# Patient Record
Sex: Female | Born: 1975
Health system: Southern US, Community
[De-identification: ages and names within clinical notes are randomized; demographics above are authoritative.]

## PROBLEM LIST (undated history)

## (undated) DIAGNOSIS — I739 Peripheral vascular disease, unspecified: Secondary | ICD-10-CM

## (undated) DIAGNOSIS — K760 Fatty (change of) liver, not elsewhere classified: Secondary | ICD-10-CM

## (undated) DIAGNOSIS — F1910 Other psychoactive substance abuse, uncomplicated: Secondary | ICD-10-CM

## (undated) DIAGNOSIS — F32A Depression, unspecified: Secondary | ICD-10-CM

## (undated) DIAGNOSIS — F329 Major depressive disorder, single episode, unspecified: Secondary | ICD-10-CM

## (undated) DIAGNOSIS — F419 Anxiety disorder, unspecified: Secondary | ICD-10-CM

## (undated) DIAGNOSIS — M7120 Synovial cyst of popliteal space [Baker], unspecified knee: Secondary | ICD-10-CM

## (undated) DIAGNOSIS — T7840XA Allergy, unspecified, initial encounter: Secondary | ICD-10-CM

## (undated) HISTORY — DX: Depression, unspecified: F32.A

## (undated) HISTORY — PX: DILATION AND CURETTAGE OF UTERUS: SHX78

## (undated) HISTORY — DX: Anxiety disorder, unspecified: F41.9

## (undated) HISTORY — DX: Peripheral vascular disease, unspecified: I73.9

## (undated) HISTORY — DX: Synovial cyst of popliteal space (Baker), unspecified knee: M71.20

## (undated) HISTORY — DX: Fatty (change of) liver, not elsewhere classified: K76.0

## (undated) HISTORY — PX: THERAPEUTIC ABORTION: SHX798

## (undated) HISTORY — DX: Other psychoactive substance abuse, uncomplicated: F19.10

## (undated) HISTORY — DX: Major depressive disorder, single episode, unspecified: F32.9

## (undated) HISTORY — DX: Allergy, unspecified, initial encounter: T78.40XA

---

## 2000-08-16 ENCOUNTER — Emergency Department (HOSPITAL_COMMUNITY): Admission: EM | Admit: 2000-08-16 | Discharge: 2000-08-16 | Payer: Self-pay | Admitting: Emergency Medicine

## 2000-08-16 ENCOUNTER — Encounter: Payer: Self-pay | Admitting: *Deleted

## 2005-07-14 ENCOUNTER — Emergency Department (HOSPITAL_COMMUNITY): Admission: EM | Admit: 2005-07-14 | Discharge: 2005-07-14 | Payer: Self-pay | Admitting: Emergency Medicine

## 2005-07-23 ENCOUNTER — Emergency Department (HOSPITAL_COMMUNITY): Admission: EM | Admit: 2005-07-23 | Discharge: 2005-07-23 | Payer: Self-pay | Admitting: *Deleted

## 2009-08-05 ENCOUNTER — Emergency Department (HOSPITAL_BASED_OUTPATIENT_CLINIC_OR_DEPARTMENT_OTHER): Admission: EM | Admit: 2009-08-05 | Discharge: 2009-08-05 | Payer: Self-pay | Admitting: Emergency Medicine

## 2010-07-18 LAB — URINALYSIS, ROUTINE W REFLEX MICROSCOPIC
Glucose, UA: NEGATIVE mg/dL
Ketones, ur: >80 mg/dL — AB
Nitrite: NEGATIVE
Protein, ur: NEGATIVE mg/dL
Specific Gravity, Urine: 1.025 (ref 1.005–1.030)
Urobilinogen, UA: 0.2 mg/dL (ref 0.0–1.0)
pH: 6 (ref 5.0–8.0)

## 2010-07-18 LAB — PREGNANCY, URINE: Preg Test, Ur: NEGATIVE

## 2010-07-18 LAB — URINE MICROSCOPIC-ADD ON

## 2010-07-18 LAB — WET PREP, GENITAL: Yeast Wet Prep HPF POC: NONE SEEN

## 2010-07-18 LAB — GC/CHLAMYDIA PROBE AMP, GENITAL
Chlamydia, DNA Probe: NEGATIVE
GC Probe Amp, Genital: POSITIVE — AB

## 2011-07-19 ENCOUNTER — Encounter: Payer: Self-pay | Admitting: Family

## 2011-07-19 ENCOUNTER — Ambulatory Visit (INDEPENDENT_AMBULATORY_CARE_PROVIDER_SITE_OTHER): Payer: 59 | Admitting: Family

## 2011-07-19 DIAGNOSIS — Z23 Encounter for immunization: Secondary | ICD-10-CM

## 2011-07-19 DIAGNOSIS — Z0000 Encounter for general adult medical examination without abnormal findings: Secondary | ICD-10-CM | POA: Insufficient documentation
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
## 2011-07-19 NOTE — Patient Instructions (Signed)
Please complete fasting lab work- lab opens at [facility]. Schedule a follow up visit for Pap smear.  Welcome to [facility]!

## 2011-07-19 NOTE — Assessment & Plan Note (Signed)
Tetanus today, pt wishes to schedule Pap/breast exam for another day.  Obtain fasting lab work.

## 2011-07-19 NOTE — Progress Notes (Signed)
Addended by: Mervin Kung A on: 07/19/2011 04:19 PM   Modules accepted: Orders

## 2011-07-20 LAB — URINALYSIS, ROUTINE W REFLEX MICROSCOPIC
Glucose, UA: NEGATIVE mg/dL
Leukocytes, UA: NEGATIVE
Protein, ur: NEGATIVE mg/dL
Specific Gravity, Urine: 1.016 (ref 1.005–1.030)
Urobilinogen, UA: 0.2 mg/dL (ref 0.0–1.0)

## 2011-07-20 LAB — CBC WITH DIFFERENTIAL/PLATELET
Basophils Absolute: 0 10*3/uL (ref 0.0–0.1)
Basophils Relative: 1 % (ref 0–1)
Eosinophils Absolute: 0.1 10*3/uL (ref 0.0–0.7)
Eosinophils Relative: 2 % (ref 0–5)
HCT: 39.5 % (ref 36.0–46.0)
Lymphocytes Relative: 22 % (ref 12–46)
MCH: 31.6 pg (ref 26.0–34.0)
MCHC: 33.4 g/dL (ref 30.0–36.0)
MCV: 94.5 fL (ref 78.0–100.0)
Monocytes Absolute: 0.7 10*3/uL (ref 0.1–1.0)
RDW: 13.1 % (ref 11.5–15.5)

## 2011-07-20 LAB — URINALYSIS, MICROSCOPIC ONLY

## 2011-07-20 LAB — LIPID PANEL
Cholesterol: 184 mg/dL (ref 0–200)
LDL Cholesterol: 61 mg/dL (ref 0–99)
VLDL: 20 mg/dL (ref 0–40)

## 2011-07-20 LAB — BASIC METABOLIC PANEL WITH GFR
BUN: 11 mg/dL (ref 6–23)
CO2: 22 mEq/L (ref 19–32)
Calcium: 9.3 mg/dL (ref 8.4–10.5)
Chloride: 100 mEq/L (ref 96–112)
Creat: 0.55 mg/dL (ref 0.50–1.10)
Glucose, Bld: 82 mg/dL (ref 70–99)

## 2011-07-20 LAB — HEPATIC FUNCTION PANEL
ALT: 13 U/L (ref 0–35)
Albumin: 4.4 g/dL (ref 3.5–5.2)
Indirect Bilirubin: 0.5 mg/dL (ref 0.0–0.9)
Total Protein: 7.4 g/dL (ref 6.0–8.3)

## 2011-07-22 ENCOUNTER — Encounter: Payer: Self-pay | Admitting: Family

## 2011-07-22 ENCOUNTER — Telehealth: Payer: Self-pay | Admitting: Family

## 2011-07-22 NOTE — Telephone Encounter (Signed)
Pt.notified

## 2011-07-22 NOTE — Telephone Encounter (Signed)
Pls call pt and let her know that her lab work is all normal and her HIV screen is negative.

## 2011-07-23 ENCOUNTER — Ambulatory Visit: Payer: 59 | Admitting: Family

## 2011-07-25 ENCOUNTER — Telehealth: Payer: Self-pay | Admitting: Family

## 2011-07-25 NOTE — Telephone Encounter (Signed)
Left message on machine to return my call. 

## 2011-07-25 NOTE — Telephone Encounter (Signed)
Patient states that she had labs done last Friday. She would like to know if we checked to see if she was pregnant at that visit?

## 2011-07-25 NOTE — Telephone Encounter (Signed)
Pt returned my call and was advised that a pregnancy test was not performed at her last visit.

## 2011-08-19 ENCOUNTER — Ambulatory Visit (HOSPITAL_BASED_OUTPATIENT_CLINIC_OR_DEPARTMENT_OTHER): Payer: 59

## 2011-08-21 ENCOUNTER — Ambulatory Visit (HOSPITAL_BASED_OUTPATIENT_CLINIC_OR_DEPARTMENT_OTHER): Payer: 59

## 2011-08-21 ENCOUNTER — Ambulatory Visit: Payer: Self-pay | Admitting: Family Medicine

## 2011-11-21 ENCOUNTER — Ambulatory Visit (INDEPENDENT_AMBULATORY_CARE_PROVIDER_SITE_OTHER): Payer: 59 | Admitting: Family

## 2011-11-21 VITALS — BP 96/70 | HR 85 | Wt 170.0 lb

## 2011-11-21 DIAGNOSIS — Z3490 Encounter for supervision of normal pregnancy, unspecified, unspecified trimester: Secondary | ICD-10-CM

## 2011-11-21 DIAGNOSIS — N912 Amenorrhea, unspecified: Secondary | ICD-10-CM

## 2011-11-21 DIAGNOSIS — Z331 Pregnant state, incidental: Secondary | ICD-10-CM

## 2011-11-21 MED ORDER — PRENATAL MULTIVIT-IRON PO TABS: 1.0000 | ORAL_TABLET | Freq: Every day | ORAL | Status: DC

## 2011-11-21 NOTE — Progress Notes (Signed)
  Subjective:    Patient ID: Elizabeth Reese, female    DOB: [birth year], 36 y.o.   MRN: [id number]  HPI  Ms.  Elizabeth Reese is a 36 yr old female who presents today for routine GYN exam.  She reports that her menstrual period is 17 days late.  She reports breast tenderness and cramping.  Concerned she may be pregnant. She is due for pap smear. Last pap >1 yr ago.  Dr. Chilton Si.  Always normal.     Review of Systems See HPI  Past Medical History  Diagnosis Date  . Migraine     History   Social History  . Marital Status: Single    Spouse Name: N/A    Number of Children: 0  . Years of Education: N/A   Occupational History  . Not on file.   Social History Main Topics  . Smoking status: Current Some Day Smoker    Types: Cigarettes  . Smokeless tobacco: Never Used   Comment: 4 cigarettes 2-3 x weekly  . Alcohol Use: 5.0 oz/week    10 drink(s) per week  . Drug Use: Not on file  . Sexually Active: Not on file   Other Topics Concern  . Not on file   Social History Narrative   Caffeine use:  NoRegular exercise: noWorks as a Engineer, structural.Single, no kidsSome collegeLots o family nearby.     No past surgical history on file.  Family History  Problem Relation Age of Onset  . Hypertension Mother   . Hypertension Maternal Aunt   . Diabetes Maternal Uncle   . Arthritis Maternal Grandmother   . Hypertension Maternal Grandmother   . Cancer Maternal Grandfather     ? liver cancer    No Known Allergies  No current outpatient prescriptions on file prior to visit.    BP 96/70  Pulse 85  Wt 170 lb (77.111 kg)  SpO2 99%  LMP 09/30/2011       Objective:   Physical Exam  Constitutional: She is oriented to person, place, and time. She appears well-developed and well-nourished. No distress.  Cardiovascular: Normal rate and regular rhythm.   No murmur heard. Pulmonary/Chest: Effort normal and breath sounds normal. No respiratory distress. She has no wheezes.  She has no rales. She exhibits no tenderness.  Neurological: She is alert and oriented to person, place, and time.  Skin: Skin is warm and dry.  Psychiatric: She has a normal mood and affect. Her behavior is normal. Judgment and thought content normal.          Assessment & Plan:

## 2011-11-21 NOTE — Patient Instructions (Addendum)
You will be contact about your referral to OB/GYN. Please let us know if you have not heard back within 1 week about your referral. Congratulations!

## 2011-11-22 LAB — HCG, QUANTITATIVE, PREGNANCY: hCG, Beta Chain, Quant, S: 94449.2 m[IU]/mL

## 2011-11-27 DIAGNOSIS — Z3490 Encounter for supervision of normal pregnancy, unspecified, unspecified trimester: Secondary | ICD-10-CM | POA: Insufficient documentation

## 2011-11-27 NOTE — Assessment & Plan Note (Signed)
Urine hcg +.  Obtain quant hcg. Refer to OB.  Add prenatal vitamin.  Pt counseled on foods to avoid during pregnancy as well as to avoid ETOH. Defer pap to OB.

## 2012-06-23 ENCOUNTER — Ambulatory Visit: Payer: 59 | Admitting: Physical Therapy

## 2012-06-23 ENCOUNTER — Ambulatory Visit: Payer: 59 | Admitting: Family

## 2012-06-30 ENCOUNTER — Ambulatory Visit: Payer: BC Managed Care – PPO | Attending: Specialist | Admitting: Rehabilitation

## 2012-06-30 DIAGNOSIS — M25569 Pain in unspecified knee: Secondary | ICD-10-CM | POA: Insufficient documentation

## 2012-06-30 DIAGNOSIS — IMO0001 Reserved for inherently not codable concepts without codable children: Secondary | ICD-10-CM | POA: Insufficient documentation

## 2012-07-14 ENCOUNTER — Emergency Department (HOSPITAL_BASED_OUTPATIENT_CLINIC_OR_DEPARTMENT_OTHER)
Admission: EM | Admit: 2012-07-14 | Discharge: 2012-07-14 | Disposition: A | Discharge status: Home / Self Care | Payer: BC Managed Care – PPO | Attending: Emergency Medicine | Admitting: Emergency Medicine

## 2012-07-14 ENCOUNTER — Encounter (HOSPITAL_BASED_OUTPATIENT_CLINIC_OR_DEPARTMENT_OTHER): Payer: Self-pay | Admitting: *Deleted

## 2012-07-14 ENCOUNTER — Emergency Department (HOSPITAL_BASED_OUTPATIENT_CLINIC_OR_DEPARTMENT_OTHER): Payer: BC Managed Care – PPO

## 2012-07-14 DIAGNOSIS — Y929 Unspecified place or not applicable: Secondary | ICD-10-CM | POA: Insufficient documentation

## 2012-07-14 DIAGNOSIS — W2203XA Walked into furniture, initial encounter: Secondary | ICD-10-CM | POA: Insufficient documentation

## 2012-07-14 DIAGNOSIS — F17200 Nicotine dependence, unspecified, uncomplicated: Secondary | ICD-10-CM | POA: Insufficient documentation

## 2012-07-14 DIAGNOSIS — S62339A Displaced fracture of neck of unspecified metacarpal bone, initial encounter for closed fracture: Secondary | ICD-10-CM

## 2012-07-14 DIAGNOSIS — Z8679 Personal history of other diseases of the circulatory system: Secondary | ICD-10-CM | POA: Insufficient documentation

## 2012-07-14 DIAGNOSIS — S62319A Displaced fracture of base of unspecified metacarpal bone, initial encounter for closed fracture: Secondary | ICD-10-CM | POA: Insufficient documentation

## 2012-07-14 DIAGNOSIS — Y9389 Activity, other specified: Secondary | ICD-10-CM | POA: Insufficient documentation

## 2012-07-14 MED ORDER — HYDROCODONE-ACETAMINOPHEN 5-325 MG PO TABS: 2.0000 | ORAL_TABLET | ORAL | Status: DC | PRN

## 2012-07-14 NOTE — ED Notes (Signed)
EMT at bedside applying splint 

## 2012-07-14 NOTE — ED Provider Notes (Signed)
Medical screening examination/treatment/procedure(s) were performed by non-physician practitioner and as supervising physician I was immediately available for consultation/collaboration.   David H Yao, MD 07/14/12 2031 

## 2012-07-14 NOTE — ED Notes (Signed)
Pain in her right hand x 2 weeks since hitting it against a wooden bar.

## 2012-07-14 NOTE — ED Provider Notes (Signed)
History     CSN: [id number]  Arrival date & time 07/14/12  1726   First MD Initiated Contact with Patient 07/14/12 1747      Chief Complaint  Patient presents with  . Hand Injury    (Consider location/radiation/quality/duration/timing/severity/associated sxs/prior treatment) HPI Comments: Pt state that he hit her hand on the right on a table and is now having pain along the 5th metacarpal  Patient is a 37 y.o. female presenting with hand injury. The history is provided by the patient. No language interpreter was used.  Hand Injury Location:  Hand Time since incident:  1 week Hand location:  R hand Pain details:    Quality:  Aching   Radiates to:  Does not radiate   Severity:  Moderate   Timing:  Constant   Past Medical History  Diagnosis Date  . Migraine     History reviewed. No pertinent past surgical history.  Family History  Problem Relation Age of Onset  . Hypertension Mother   . Hypertension Maternal Aunt   . Diabetes Maternal Uncle   . Arthritis Maternal Grandmother   . Hypertension Maternal Grandmother   . Cancer Maternal Grandfather     ? liver cancer    History  Substance Use Topics  . Smoking status: Current Some Day Smoker -- 0.50 packs/day    Types: Cigarettes  . Smokeless tobacco: Never Used     Comment: 4 cigarettes 2-3 x weekly  . Alcohol Use: 5.0 oz/week    10 drink(s) per week    OB History   Grav Para Term Preterm Abortions TAB SAB Ect Mult Living                  Review of Systems  Constitutional: Negative.   Respiratory: Negative.   Cardiovascular: Negative.     Allergies  Review of patient's allergies indicates no known allergies.  Home Medications   Current Outpatient Rx  Name  Route  Sig  Dispense  Refill  . Prenatal Vit-Fe Sulfate-FA (PRENATAL MULTIVIT-IRON) TABS   Oral   Take 1 tablet by mouth daily.      0     BP 112/77  Pulse 76  Temp(Src) 98.3 F (36.8 C) (Oral)  Resp 16  Wt 170 lb (77.111 kg)  BMI  28.74 kg/m2  SpO2 98%  Physical Exam  Nursing note and vitals reviewed. Constitutional: She is oriented to person, place, and time. She appears well-developed and well-nourished.  Cardiovascular: Normal rate and regular rhythm.   Pulmonary/Chest: Effort normal and breath sounds normal.  Musculoskeletal:  Pt tender along the right fifth metacarpal with mild distal swelling noted:pt has full WUX:LKGMWN intact  Neurological: She is alert and oriented to person, place, and time.  Skin: Skin is warm and dry.  Psychiatric: She has a normal mood and affect.    ED Course  Procedures (including critical care time)  Labs Reviewed - No data to display Dg Hand Complete Right  07/14/2012  *RADIOLOGY REPORT*  Clinical Data: Pain and swelling in the region of the right fifth metacarpal head following an injury 1 week ago.  RIGHT HAND - COMPLETE 3+ VIEW  Comparison: None.  Findings: Fracture of the right fifth metacarpal neck with ventral radial angulation and mild ventral radial displacement of the distal fragment.  No additional fractures are seen.  IMPRESSION: Right fifth metacarpal boxer's fracture, as described above.   Original Report Authenticated By: Beckie Salts, M.D.      1. Boxer's fracture,  closed, initial encounter       MDM  Pt is okay to follow up with hand:pt splinted although the injury is a week old:pt is neurovascularly intact        Teressa Lower, NP 07/14/12 1926

## 2012-07-21 ENCOUNTER — Encounter: Payer: 59 | Admitting: Family

## 2012-09-23 ENCOUNTER — Telehealth: Payer: Self-pay | Admitting: Family

## 2012-09-23 NOTE — Telephone Encounter (Signed)
Please confirm no suicidal ideation.  I would like to see her in the office first and then we can decide how best to help her.

## 2012-09-23 NOTE — Telephone Encounter (Signed)
Please advise 

## 2012-09-23 NOTE — Telephone Encounter (Signed)
Spoke with pt, she denies suicide ideation. Has physical on 10/02/12. Advised pt that I could schedule a visit to discuss depression but will not be able to get physical any earlier. She voices understanding and scheduled appt for 09/28/12 at 3:15pm.

## 2012-09-23 NOTE — Telephone Encounter (Signed)
Patient is feeling depressed and would like a referral to a mental health professional.

## 2012-09-23 NOTE — Telephone Encounter (Signed)
Left message on voicemail to return my call.  

## 2012-09-28 ENCOUNTER — Encounter: Payer: Self-pay | Admitting: Family

## 2012-09-28 ENCOUNTER — Encounter: Payer: Self-pay | Admitting: *Deleted

## 2012-09-28 ENCOUNTER — Ambulatory Visit (INDEPENDENT_AMBULATORY_CARE_PROVIDER_SITE_OTHER): Payer: BC Managed Care – PPO | Admitting: Family

## 2012-09-28 VITALS — BP 104/80 | HR 83 | Temp 98.1°F | Resp 16 | Wt 162.0 lb

## 2012-09-28 DIAGNOSIS — F32A Depression, unspecified: Secondary | ICD-10-CM

## 2012-09-28 DIAGNOSIS — F329 Major depressive disorder, single episode, unspecified: Secondary | ICD-10-CM

## 2012-09-28 NOTE — Progress Notes (Signed)
  Subjective:    Patient ID: Elizabeth Reese, female    DOB: Mar 27, 1976, 37 y.o.   MRN: [id number]  HPI  Elizabeth Reese is a 37 yr old female who presents today to discuss depression. Reports that she was treated for zoloft x 3 years around age 70.  Took herself off.  Feels down, cries a lot.  Reports that she lives with her mom and does not feel "like an adult."  Had a miscarriage last June.  Denies active suicide ideation. Feels hopeless.  Reports that she sleeps well.  Denies panic attacks.    Review of Systems    see HPI  Past Medical History  Diagnosis Date  . Migraine     History   Social History  . Marital Status: Single    Spouse Name: N/A    Number of Children: 0  . Years of Education: N/A   Occupational History  . Not on file.   Social History Main Topics  . Smoking status: Current Some Day Smoker -- 0.50 packs/day    Types: Cigarettes  . Smokeless tobacco: Never Used     Comment: 4 cigarettes 2-3 x weekly  . Alcohol Use: 5.0 oz/week    10 drink(s) per week  . Drug Use: No  . Sexually Active: Not on file   Other Topics Concern  . Not on file   Social History Narrative   Caffeine use:  No   Regular exercise: no   Works as a Engineer, structural.   Single, no kids   Some college   Lots o family nearby.           No past surgical history on file.  Family History  Problem Relation Age of Onset  . Hypertension Mother   . Hypertension Maternal Aunt   . Diabetes Maternal Uncle   . Arthritis Maternal Grandmother   . Hypertension Maternal Grandmother   . Cancer Maternal Grandfather     ? liver cancer    No Known Allergies  No current outpatient prescriptions on file prior to visit.   No current facility-administered medications on file prior to visit.    BP 104/80  Pulse 83  Temp(Src) 98.1 F (36.7 C) (Oral)  Resp 16  Wt 162 lb 0.6 oz (73.501 kg)  BMI 27.39 kg/m2  SpO2 97%  LMP 09/12/2012    Objective:   Physical Exam   Constitutional: She appears well-developed and well-nourished. No distress.  Psychiatric: She has a normal mood and affect. Her behavior is normal. Judgment and thought content normal.          Assessment & Plan:

## 2012-09-28 NOTE — Assessment & Plan Note (Signed)
Deteriorated.  She would like referral to therapist. Will arrange.  We discussed possibility of adding medication. She wishes to try therapist first which is reasonable. Recommended that she call  If symptoms worsen, go to ED if SI.  She is agreeable and verbalizes understanding.

## 2012-09-28 NOTE — Patient Instructions (Addendum)
You will be contacted about your referral to the therapist.  Please let us know if you have not heard back within 1 week about your referral.

## 2012-10-02 ENCOUNTER — Encounter: Payer: Self-pay | Admitting: Family

## 2012-10-02 ENCOUNTER — Ambulatory Visit (INDEPENDENT_AMBULATORY_CARE_PROVIDER_SITE_OTHER): Payer: BC Managed Care – PPO | Admitting: Family

## 2012-10-02 VITALS — BP 102/72 | HR 77 | Temp 97.5°F | Resp 18 | Ht 64.5 in | Wt 161.1 lb

## 2012-10-02 DIAGNOSIS — Z0000 Encounter for general adult medical examination without abnormal findings: Secondary | ICD-10-CM

## 2012-10-02 DIAGNOSIS — N926 Irregular menstruation, unspecified: Secondary | ICD-10-CM

## 2012-10-02 LAB — LIPID PANEL
HDL: 72 mg/dL (ref >39)
LDL Cholesterol: 55 mg/dL (ref 0–99)

## 2012-10-02 LAB — TSH: TSH: 0.999 u[IU]/mL (ref 0.350–4.500)

## 2012-10-02 LAB — CBC WITH DIFFERENTIAL/PLATELET
Eosinophils Relative: 4 % (ref 0–5)
HCT: 37.1 % (ref 36.0–46.0)
Hemoglobin: 12.7 g/dL (ref 12.0–15.0)
Lymphocytes Relative: 26 % (ref 12–46)
Lymphs Abs: 1.3 10*3/uL (ref 0.7–4.0)
MCV: 90.3 fL (ref 78.0–100.0)
Monocytes Absolute: 0.4 10*3/uL (ref 0.1–1.0)
RBC: 4.11 MIL/uL (ref 3.87–5.11)
WBC: 4.8 10*3/uL (ref 4.0–10.5)

## 2012-10-02 LAB — BASIC METABOLIC PANEL WITH GFR
CO2: 24 mEq/L (ref 19–32)
Chloride: 105 mEq/L (ref 96–112)
Sodium: 137 mEq/L (ref 135–145)

## 2012-10-02 LAB — HEPATIC FUNCTION PANEL
ALT: 12 U/L (ref 0–35)
Alkaline Phosphatase: 44 U/L (ref 39–117)
Indirect Bilirubin: 0.5 mg/dL (ref 0.0–0.9)
Total Protein: 6.7 g/dL (ref 6.0–8.3)

## 2012-10-02 NOTE — Patient Instructions (Addendum)
Please complete your lab work prior to leaving.  Follow up in 3 months so we can see how things are going with your depression.

## 2012-10-02 NOTE — Progress Notes (Signed)
Subjective:    Patient ID: Elizabeth Reese, female    DOB: [birth year], 37 y.o.   MRN: [id number]  HPI    Patient presents today for complete physical.  Immunizations: tetanus Diet: working on healthy diet.  Exercise: not exercising. Pap Smear: Tobacco abuse 1 pack a week.   Past Medical History  Diagnosis Date  . Migraine   . Depression     History   Social History  . Marital Status: Single    Spouse Name: N/A    Number of Children: 0  . Years of Education: N/A   Occupational History  . Not on file.   Social History Main Topics  . Smoking status: Current Some Day Smoker -- 0.50 packs/day    Types: Cigarettes  . Smokeless tobacco: Never Used     Comment: 4 cigarettes 2-3 x weekly  . Alcohol Use: 5.0 oz/week    10 drink(s) per week  . Drug Use: No  . Sexually Active: Not on file   Other Topics Concern  . Not on file   Social History Narrative   Caffeine use:  No   Regular exercise: no   Works as a Engineer, structural.   Single, no kids   Some college   Lots o family nearby.           History reviewed. No pertinent past surgical history.  Family History  Problem Relation Age of Onset  . Hypertension Mother   . Hypertension Maternal Aunt   . Diabetes Maternal Uncle   . Arthritis Maternal Grandmother   . Hypertension Maternal Grandmother   . Cancer Maternal Grandfather     ? liver cancer    No Known Allergies  Current Outpatient Prescriptions on File Prior to Visit  Medication Sig Dispense Refill  . LO LOESTRIN FE 1 MG-10 MCG / 10 MCG tablet Take 1 tablet by mouth daily.       No current facility-administered medications on file prior to visit.    BP 102/72  Pulse 77  Temp(Src) 97.5 F (36.4 C) (Oral)  Resp 18  Ht 5' 4.5" (1.638 m)  Wt 161 lb 1.3 oz (73.065 kg)  BMI 27.23 kg/m2  SpO2 99%  LMP 09/12/2012     Review of Systems  Constitutional: Negative for unexpected weight change.  HENT: Negative for congestion.    Eyes: Negative for visual disturbance.  Respiratory: Negative for cough and choking.   Cardiovascular: Negative for chest pain.  Gastrointestinal: Negative for nausea, vomiting and diarrhea.  Genitourinary: Positive for frequency. Negative for dysuria.  Musculoskeletal: Negative for myalgias.       Right hip pain in AM's.    Skin: Negative for rash.  Neurological: Negative for headaches.  Hematological: Negative for adenopathy.  Psychiatric/Behavioral:       She will see a therapist re: depression       Objective:   Physical Exam  Physical Exam  Constitutional: She is oriented to person, place, and time. She appears well-developed and well-nourished. No distress.  HENT:  Head: Normocephalic and atraumatic.  Right Ear: Tympanic membrane and ear canal normal.  Left Ear: Tympanic membrane and ear canal normal.  Mouth/Throat: Oropharynx is clear and moist.  Eyes:  No scleral icterus.  Neck: Normal range of motion. No thyromegaly present.  Cardiovascular: Normal rate and regular rhythm.   No murmur heard. Pulmonary/Chest: Effort normal and breath sounds normal. No respiratory distress. He has no wheezes. She has no rales. She exhibits no  tenderness.  Abdominal: Soft. Bowel sounds are normal. He exhibits no distension and no mass. There is no tenderness. There is no rebound and no guarding.  Musculoskeletal: She exhibits no edema.  Lymphadenopathy:    She has no cervical adenopathy.  Neurological: She is alert and oriented to person, place, and time. She has normal reflexes. She exhibits normal muscle tone. Coordination normal.  Skin: Skin is warm and dry.  Psychiatric: She has a normal mood and affect. Her behavior is normal. Judgment and thought content normal.  Breast/pelvic deferred to GYN         Assessment & Plan:           Assessment & Plan:  She reports that she has been having periods every other month, request Hcg.  HcG neg. Likely due to OCP.

## 2012-10-03 LAB — URINALYSIS, MICROSCOPIC ONLY

## 2012-10-03 LAB — URINALYSIS, ROUTINE W REFLEX MICROSCOPIC
Ketones, ur: NEGATIVE mg/dL
Nitrite: NEGATIVE
Protein, ur: NEGATIVE mg/dL
Specific Gravity, Urine: 1.021 (ref 1.005–1.030)
Urobilinogen, UA: 0.2 mg/dL (ref 0.0–1.0)

## 2012-10-03 LAB — PREGNANCY, URINE: Preg Test, Ur: NEGATIVE

## 2012-10-04 ENCOUNTER — Encounter: Payer: Self-pay | Admitting: Family

## 2012-10-04 DIAGNOSIS — Z0000 Encounter for general adult medical examination without abnormal findings: Secondary | ICD-10-CM | POA: Insufficient documentation

## 2012-10-04 NOTE — Assessment & Plan Note (Signed)
Continue healthy diet, exercise, immunizations up to date. Pap per GYN. We discussed importance of complete smoking cessation.

## 2012-10-06 ENCOUNTER — Ambulatory Visit (INDEPENDENT_AMBULATORY_CARE_PROVIDER_SITE_OTHER): Payer: BC Managed Care – PPO | Admitting: Licensed Clinical Social Worker

## 2012-10-06 DIAGNOSIS — F331 Major depressive disorder, recurrent, moderate: Secondary | ICD-10-CM

## 2012-10-13 ENCOUNTER — Ambulatory Visit: Payer: BC Managed Care – PPO | Admitting: Licensed Clinical Social Worker

## 2012-10-29 ENCOUNTER — Ambulatory Visit (INDEPENDENT_AMBULATORY_CARE_PROVIDER_SITE_OTHER): Payer: BC Managed Care – PPO | Admitting: Licensed Clinical Social Worker

## 2012-10-29 DIAGNOSIS — F331 Major depressive disorder, recurrent, moderate: Secondary | ICD-10-CM

## 2012-11-05 ENCOUNTER — Ambulatory Visit: Payer: BC Managed Care – PPO | Admitting: Licensed Clinical Social Worker

## 2012-11-12 ENCOUNTER — Ambulatory Visit (INDEPENDENT_AMBULATORY_CARE_PROVIDER_SITE_OTHER): Payer: BC Managed Care – PPO | Admitting: Licensed Clinical Social Worker

## 2012-11-12 DIAGNOSIS — F331 Major depressive disorder, recurrent, moderate: Secondary | ICD-10-CM

## 2012-11-26 ENCOUNTER — Ambulatory Visit (INDEPENDENT_AMBULATORY_CARE_PROVIDER_SITE_OTHER): Payer: BC Managed Care – PPO | Admitting: Licensed Clinical Social Worker

## 2012-11-26 DIAGNOSIS — F331 Major depressive disorder, recurrent, moderate: Secondary | ICD-10-CM

## 2012-12-17 ENCOUNTER — Ambulatory Visit: Payer: BC Managed Care – PPO | Admitting: Licensed Clinical Social Worker

## 2012-12-23 ENCOUNTER — Telehealth: Payer: Self-pay | Admitting: *Deleted

## 2012-12-23 MED ORDER — NORETHIN-ETH ESTRAD-FE BIPHAS 1 MG-10 MCG / 10 MCG PO TABS: 1.0000 | ORAL_TABLET | Freq: Every day | ORAL | Status: DC

## 2012-12-23 NOTE — Telephone Encounter (Signed)
Received message from pt requesting refill on lo-loestrin fe.  Refill sent to pharmacy. Pt is due for follow up in September. Please call pt to arrange appt.

## 2012-12-24 NOTE — Telephone Encounter (Signed)
Spoke to patient and informed her of medication refill and she scheduled appointment for 01/08/13

## 2013-01-08 ENCOUNTER — Ambulatory Visit: Payer: BC Managed Care – PPO | Admitting: Family

## 2013-02-22 ENCOUNTER — Encounter: Payer: Self-pay | Admitting: Family

## 2013-02-22 ENCOUNTER — Ambulatory Visit (INDEPENDENT_AMBULATORY_CARE_PROVIDER_SITE_OTHER): Payer: BC Managed Care – PPO | Admitting: Family

## 2013-02-22 VITALS — BP 110/70 | HR 67 | Temp 98.6°F | Resp 16 | Ht 64.5 in | Wt 148.1 lb

## 2013-02-22 DIAGNOSIS — Z803 Family history of malignant neoplasm of breast: Secondary | ICD-10-CM

## 2013-02-22 DIAGNOSIS — F329 Major depressive disorder, single episode, unspecified: Secondary | ICD-10-CM

## 2013-02-22 DIAGNOSIS — Z0000 Encounter for general adult medical examination without abnormal findings: Secondary | ICD-10-CM

## 2013-02-22 DIAGNOSIS — F32A Depression, unspecified: Secondary | ICD-10-CM

## 2013-02-22 MED ORDER — NORETHIN-ETH ESTRAD-FE BIPHAS 1 MG-10 MCG / 10 MCG PO TABS: 1.0000 | ORAL_TABLET | Freq: Every day | ORAL | Status: DC

## 2013-02-22 NOTE — Patient Instructions (Signed)
Please schedule mammogram- call [phone] Follow up in 6 months, sooner if problems or concerns.

## 2013-02-22 NOTE — Progress Notes (Signed)
  Subjective:    Patient ID: Elizabeth Reese, female    DOB: 12-13-1975, 37 y.o.   MRN: [id number]  HPI  Elizabeth Reese is a 37 yr old female who presents today to discuss depression. Reports that she has not met with Berniece Andreas in month due to scheduling issues.  She has started exercising and eating better.  Goes 3-4 times a week does cardio.  Reports that she has started eating more fruits/veggies.  Has cut down on alcohol. Quit smoking 12/03/12. Overall mood is improved.   Maternal GM age 85- just diagnosed with breast CA. She is requesting screening mammogram.   Review of Systems See HPI  Past Medical History  Diagnosis Date  . Migraine   . Depression     History   Social History  . Marital Status: Single    Spouse Name: N/A    Number of Children: 0  . Years of Education: N/A   Occupational History  . Not on file.   Social History Main Topics  . Smoking status: Current Some Day Smoker -- 0.50 packs/day    Types: Cigarettes  . Smokeless tobacco: Never Used     Comment: 4 cigarettes 2-3 x weekly  . Alcohol Use: 5.0 oz/week    10 drink(s) per week  . Drug Use: No  . Sexual Activity: Not on file   Other Topics Concern  . Not on file   Social History Narrative   Caffeine use:  No   Regular exercise: no   Works as a Engineer, structural.   Single, no kids   Some college   Lots o family nearby.           No past surgical history on file.  Family History  Problem Relation Age of Onset  . Hypertension Mother   . Hypertension Maternal Aunt   . Diabetes Maternal Uncle   . Arthritis Maternal Grandmother   . Hypertension Maternal Grandmother   . Cancer Maternal Grandmother 63    breast  . Cancer Maternal Grandfather     ? liver cancer    No Known Allergies  No current outpatient prescriptions on file prior to visit.   No current facility-administered medications on file prior to visit.    BP 110/70  Pulse 67  Temp(Src) 98.6 F (37 C)  (Oral)  Resp 16  Ht 5' 4.5" (1.638 m)  Wt 148 lb 1.3 oz (67.169 kg)  BMI 25.03 kg/m2  SpO2 99%       Objective:   Physical Exam  Constitutional: She is oriented to person, place, and time. She appears well-developed and well-nourished. No distress.  Cardiovascular: Normal rate and regular rhythm.   No murmur heard. Pulmonary/Chest: Effort normal and breath sounds normal. No respiratory distress. She has no wheezes. She has no rales. She exhibits no tenderness.  Musculoskeletal: She exhibits no edema.  Neurological: She is alert and oriented to person, place, and time.  Psychiatric: She has a normal mood and affect. Her behavior is normal. Judgment and thought content normal.          Assessment & Plan:

## 2013-02-24 DIAGNOSIS — Z803 Family history of malignant neoplasm of breast: Secondary | ICD-10-CM | POA: Insufficient documentation

## 2013-02-24 NOTE — Assessment & Plan Note (Signed)
Depression is stable off of meds. Continue to monitor.  I commended her on her multiple healthy lifestyle changes.

## 2013-02-24 NOTE — Assessment & Plan Note (Signed)
Refer for screening mammogram.

## 2013-05-25 ENCOUNTER — Telehealth: Payer: Self-pay | Admitting: Family

## 2013-05-25 DIAGNOSIS — Z0000 Encounter for general adult medical examination without abnormal findings: Secondary | ICD-10-CM

## 2013-05-25 MED ORDER — NORETHIN-ETH ESTRAD-FE BIPHAS 1 MG-10 MCG / 10 MCG PO TABS: 1.0000 | ORAL_TABLET | Freq: Every day | ORAL | Status: DC

## 2013-05-25 NOTE — Telephone Encounter (Signed)
Refill sent.

## 2013-05-25 NOTE — Telephone Encounter (Signed)
Would like refill of birth congtrol to [facility]

## 2013-06-01 ENCOUNTER — Other Ambulatory Visit: Payer: Self-pay | Admitting: Family

## 2013-06-01 NOTE — Telephone Encounter (Signed)
Please advise refill? Med never filled by you, do you wish to fill?

## 2013-06-02 NOTE — Telephone Encounter (Signed)
Denial sent to pharmacy with note to use refills on file.

## 2013-06-02 NOTE — Telephone Encounter (Signed)
This was filled on 1/27 by me.

## 2013-08-13 ENCOUNTER — Other Ambulatory Visit: Payer: Self-pay | Admitting: Family

## 2013-08-24 ENCOUNTER — Ambulatory Visit: Payer: BC Managed Care – PPO | Admitting: Family

## 2013-09-13 ENCOUNTER — Emergency Department (HOSPITAL_COMMUNITY)
Admission: EM | Admit: 2013-09-13 | Discharge: 2013-09-13 | Disposition: A | Discharge status: Home / Self Care | Payer: BC Managed Care – PPO | Attending: Emergency Medicine | Admitting: Emergency Medicine

## 2013-09-13 ENCOUNTER — Emergency Department (HOSPITAL_COMMUNITY): Payer: BC Managed Care – PPO

## 2013-09-13 ENCOUNTER — Encounter (HOSPITAL_COMMUNITY): Payer: Self-pay | Admitting: Emergency Medicine

## 2013-09-13 DIAGNOSIS — W01119A Fall on same level from slipping, tripping and stumbling with subsequent striking against unspecified sharp object, initial encounter: Secondary | ICD-10-CM | POA: Insufficient documentation

## 2013-09-13 DIAGNOSIS — Z79899 Other long term (current) drug therapy: Secondary | ICD-10-CM | POA: Insufficient documentation

## 2013-09-13 DIAGNOSIS — W010XXA Fall on same level from slipping, tripping and stumbling without subsequent striking against object, initial encounter: Secondary | ICD-10-CM | POA: Insufficient documentation

## 2013-09-13 DIAGNOSIS — Z8781 Personal history of (healed) traumatic fracture: Secondary | ICD-10-CM | POA: Insufficient documentation

## 2013-09-13 DIAGNOSIS — F17200 Nicotine dependence, unspecified, uncomplicated: Secondary | ICD-10-CM | POA: Insufficient documentation

## 2013-09-13 DIAGNOSIS — S61219A Laceration without foreign body of unspecified finger without damage to nail, initial encounter: Secondary | ICD-10-CM

## 2013-09-13 DIAGNOSIS — Y9389 Activity, other specified: Secondary | ICD-10-CM | POA: Insufficient documentation

## 2013-09-13 DIAGNOSIS — S61409A Unspecified open wound of unspecified hand, initial encounter: Secondary | ICD-10-CM | POA: Insufficient documentation

## 2013-09-13 DIAGNOSIS — Z8679 Personal history of other diseases of the circulatory system: Secondary | ICD-10-CM | POA: Insufficient documentation

## 2013-09-13 DIAGNOSIS — Y9289 Other specified places as the place of occurrence of the external cause: Secondary | ICD-10-CM | POA: Insufficient documentation

## 2013-09-13 DIAGNOSIS — Z8659 Personal history of other mental and behavioral disorders: Secondary | ICD-10-CM | POA: Insufficient documentation

## 2013-09-13 NOTE — ED Provider Notes (Signed)
CSN: [id number]     Arrival date & time 09/13/13  0335 History   First MD Initiated Contact with Patient 09/13/13 0601     Chief Complaint  Patient presents with  . Laceration     (Consider location/radiation/quality/duration/timing/severity/associated sxs/prior Treatment) Patient is a 38 y.o. female presenting with skin laceration. The history is provided by the patient and medical records.  Laceration  This is a 38 y.o. F with PMH significant for migraine headaches and depression, presenting to the ED for laceration.  Pt states she tripped and fell onto a glass table with her hand palmar flexed, causing the table to shatter.  No head trauma or LOC.  Pt sustained a small laceration during fall, also notes some pain and swelling along 5th metacarpal.  Prior boxer's fracture of right hand within the past 2 years.  Pt is right hand dominant.  Tetanus UTD.    Past Medical History  Diagnosis Date  . Migraine   . Depression    History reviewed. No pertinent past surgical history. Family History  Problem Relation Age of Onset  . Hypertension Mother   . Hypertension Maternal Aunt   . Diabetes Maternal Uncle   . Arthritis Maternal Grandmother   . Hypertension Maternal Grandmother   . Cancer Maternal Grandmother [phone]    breast  . Cancer Maternal Grandfather     ? liver cancer   History  Substance Use Topics  . Smoking status: Current Some Day Smoker -- 0.50 packs/day    Types: Cigarettes  . Smokeless tobacco: Never Used     Comment: 4 cigarettes 2-3 x weekly  . Alcohol Use: 5.0 oz/week    10 drink(s) per week   OB History   Grav Para Term Preterm Abortions TAB SAB Ect Mult Living                 Review of Systems  Musculoskeletal: Positive for arthralgias.  Skin: Positive for wound.  All other systems reviewed and are negative.     Allergies  Review of patient's allergies indicates no known allergies.  Home Medications   Prior to Admission medications   Medication  Sig Start Date End Date Taking? Authorizing Provider  naproxen sodium (ANAPROX) 220 MG tablet Take 440 mg by mouth daily as needed (pain).   Yes Historical Provider, MD  Norethindrone-Ethinyl Estradiol-Fe Biphas (LO LOESTRIN FE) 1 MG-10 MCG / 10 MCG tablet Take 1 tablet by mouth daily.   Yes Historical Provider, MD   BP 113/76  Pulse 80  Temp(Src) 97.7 F (36.5 C) (Oral)  Resp 18  SpO2 97%  LMP 08/31/2013  Physical Exam  Nursing note and vitals reviewed. Constitutional: She is oriented to person, place, and time. She appears well-developed and well-nourished. No distress.  HENT:  Head: Normocephalic and atraumatic.  Mouth/Throat: Oropharynx is clear and moist.  Eyes: Conjunctivae and EOM are normal. Pupils are equal, round, and reactive to light.  Neck: Normal range of motion. Neck supple.  Cardiovascular: Normal rate, regular rhythm and normal heart sounds.   Pulmonary/Chest: Effort normal and breath sounds normal. No respiratory distress. She has no wheezes.  Musculoskeletal: Normal range of motion.       Right hand: She exhibits tenderness, bony tenderness, laceration and swelling. She exhibits normal range of motion and no deformity. Normal sensation noted. Normal strength noted.       Hands: Right 5th digit with small, 1cm laceration just distal to right 5th MCP joint; swelling and bruising surrounding MCP  joint; full flexion/extension of 5th digit all at joints; normal strength against resistance; sensation intact diffusely throughout digit Small superficial laceration to right proximal forearm; no active bleeding  Neurological: She is alert and oriented to person, place, and time.  Skin: Skin is warm and dry. She is not diaphoretic.  Psychiatric: She has a normal mood and affect.    ED Course  Procedures (including critical care time)  LACERATION REPAIR Performed by: Garlon HatchetLisa M Lenora Gomes Authorized by: Garlon HatchetLisa M Halden Phegley Consent: Verbal consent obtained. Risks and benefits: risks,  benefits and alternatives were discussed Consent given by: patient Patient identity confirmed: provided demographic data Prepped and Draped in normal sterile fashion Wound explored  Laceration Location: proximal right 5th digit  Laceration Length: 1cm  No Foreign Bodies seen or palpated  Anesthesia: local infiltration  Local anesthetic: lidocaine 1% without epinephrine  Anesthetic total: 4 ml  Irrigation method: syringe Amount of cleaning: standard  Skin closure: 4-0 prolene  Number of sutures: 3  Technique: simple interrupted  Patient tolerance: Patient tolerated the procedure well with no immediate complications.  Labs Review Labs Reviewed - No data to display  Imaging Review Dg Hand Complete Right  09/13/2013   CLINICAL DATA:  Laceration to the 8 proximal fifth finger.  EXAM: RIGHT HAND - COMPLETE 3+ VIEW  COMPARISON:  07/14/2012.  FINDINGS: Three views of the right hand demonstrate no acute displaced fracture, subluxation, dislocation, joint or soft tissue abnormality. Specifically, no retained radiopaque foreign body within the soft tissues. Old healed fracture of the distal fifth metacarpal incidentally noted.  IMPRESSION: 1. No acute radiographic abnormality of the right hand. 2. Old healed fifth metacarpal fracture again noted.   Electronically Signed   By: Trudie Reedaniel  Entrikin M.D.   On: 09/13/2013 06:41     EKG Interpretation None      MDM   Final diagnoses:  Laceration of finger of right hand   38 y.o. F with laceration to right proximal 5th digit.  She does have a moderate amount of swelling and bruising to 5th MCP joint.  Does have hx of prior boxer's fracture of right hand.  Will obtain screening x-ray.  Tetanus UTD.  X-ray negative for acute fx.  Laceration repaired as above, pt tolerated well.  Small superficial laceration to right proximal forearm not requiring repair.  Will FU with PCP in 1 week for suture removal.  Instructed on home wound care and  dressing changes.  Discussed plan with patient, he/she acknowledged understanding and agreed with plan of care.  Return precautions given for new or worsening symptoms.  Garlon HatchetLisa M Andreas Sobolewski, PA-C 09/13/13 [phone]

## 2013-09-13 NOTE — Discharge Instructions (Signed)
Keep sutures clean and dry. °Follow up with your primary care physician in 1 week for suture removal. °Return to the ED for new concerns. °

## 2013-09-13 NOTE — ED Notes (Signed)
Returned from xray

## 2013-09-13 NOTE — ED Notes (Signed)
Pt. presents with laceration at right proximal 5th finger sustained this evening from a broken glass table . Bleeding controlled. Dressing applied at triage .

## 2013-09-13 NOTE — ED Notes (Signed)
Patient transported to X-ray 

## 2013-09-17 NOTE — ED Provider Notes (Signed)
Medical screening examination/treatment/procedure(s) were performed by non-physician practitioner and as supervising physician I was immediately available for consultation/collaboration.   EKG Interpretation None        Brandt Loosen, MD 09/17/13 2026

## 2013-10-04 ENCOUNTER — Ambulatory Visit: Payer: BC Managed Care – PPO | Admitting: Licensed Clinical Social Worker

## 2013-10-08 ENCOUNTER — Encounter: Payer: BC Managed Care – PPO | Admitting: Family

## 2013-10-19 ENCOUNTER — Ambulatory Visit: Payer: BC Managed Care – PPO | Admitting: Licensed Clinical Social Worker

## 2013-10-27 ENCOUNTER — Encounter: Payer: BC Managed Care – PPO | Admitting: Family

## 2013-11-22 ENCOUNTER — Ambulatory Visit (INDEPENDENT_AMBULATORY_CARE_PROVIDER_SITE_OTHER): Payer: BC Managed Care – PPO | Admitting: Licensed Clinical Social Worker

## 2013-11-22 DIAGNOSIS — F331 Major depressive disorder, recurrent, moderate: Secondary | ICD-10-CM

## 2013-11-29 ENCOUNTER — Ambulatory Visit: Payer: BC Managed Care – PPO | Admitting: Licensed Clinical Social Worker

## 2013-12-07 ENCOUNTER — Ambulatory Visit: Payer: BC Managed Care – PPO | Admitting: Licensed Clinical Social Worker

## 2013-12-11 ENCOUNTER — Other Ambulatory Visit: Payer: Self-pay | Admitting: Family

## 2013-12-14 ENCOUNTER — Ambulatory Visit: Payer: BC Managed Care – PPO | Admitting: Licensed Clinical Social Worker

## 2013-12-21 ENCOUNTER — Ambulatory Visit: Payer: BC Managed Care – PPO | Admitting: Licensed Clinical Social Worker

## 2014-03-06 ENCOUNTER — Other Ambulatory Visit: Payer: Self-pay | Admitting: Family

## 2014-03-07 NOTE — Telephone Encounter (Signed)
30 day supply lo-estrin sent to pharmacy. Pt was due for follow up in May and will need to be seen before further refills can be given. Please call pt to arrange follow up.

## 2014-04-01 ENCOUNTER — Other Ambulatory Visit: Payer: Self-pay | Admitting: Family

## 2014-04-26 ENCOUNTER — Other Ambulatory Visit: Payer: Self-pay | Admitting: Family

## 2014-04-27 ENCOUNTER — Other Ambulatory Visit: Payer: Self-pay | Admitting: Family

## 2014-04-27 NOTE — Telephone Encounter (Signed)
Left a message for call back.  

## 2014-04-27 NOTE — Telephone Encounter (Signed)
Please advise patient that she needs a pap smear. Please schedule. 30 day supply of medication given only.

## 2014-05-23 ENCOUNTER — Telehealth: Payer: Self-pay | Admitting: Family

## 2014-05-23 NOTE — Telephone Encounter (Signed)
Rx request Denied, patient last OV 02/22/2013, Needs Office Visit prior to refill authorization/SLS

## 2014-05-24 NOTE — Telephone Encounter (Signed)
Notified pt and she voices understanding. She states she will be out of medication in 1 week and did not want to be without meds. Advised pt she could call back at the first of the week to see if there have been any cancellations. Pt voices understanding.

## 2014-05-24 NOTE — Telephone Encounter (Signed)
Patient requesting one more refill. She only has one week left. Patient scheduled cpe for 06/08/14.   [facility] [address]

## 2014-05-24 NOTE — Telephone Encounter (Signed)
She has not been seen in greater than 1 year so I cannot refill until I see her.  She no showed her apt in June.

## 2014-05-24 NOTE — Telephone Encounter (Signed)
Please advise 

## 2014-06-08 ENCOUNTER — Encounter: Payer: Self-pay | Admitting: Family

## 2014-06-08 ENCOUNTER — Ambulatory Visit (INDEPENDENT_AMBULATORY_CARE_PROVIDER_SITE_OTHER): Payer: BLUE CROSS/BLUE SHIELD | Admitting: Family

## 2014-06-08 VITALS — BP 110/72 | HR 76 | Temp 97.7°F | Resp 16 | Ht 64.5 in | Wt 146.1 lb

## 2014-06-08 DIAGNOSIS — G4700 Insomnia, unspecified: Secondary | ICD-10-CM | POA: Insufficient documentation

## 2014-06-08 DIAGNOSIS — F32A Depression, unspecified: Secondary | ICD-10-CM

## 2014-06-08 DIAGNOSIS — F329 Major depressive disorder, single episode, unspecified: Secondary | ICD-10-CM

## 2014-06-08 DIAGNOSIS — Z0000 Encounter for general adult medical examination without abnormal findings: Secondary | ICD-10-CM

## 2014-06-08 LAB — HEPATIC FUNCTION PANEL
ALT: 15 U/L (ref 0–35)
AST: 20 U/L (ref 0–37)
Albumin: 3.9 g/dL (ref 3.5–5.2)
Alkaline Phosphatase: 49 U/L (ref 39–117)
BILIRUBIN TOTAL: 0.8 mg/dL (ref 0.2–1.2)
Bilirubin, Direct: 0.2 mg/dL (ref 0.0–0.3)
Total Protein: 6.9 g/dL (ref 6.0–8.3)

## 2014-06-08 LAB — URINALYSIS, ROUTINE W REFLEX MICROSCOPIC
Bilirubin Urine: NEGATIVE
KETONES UR: NEGATIVE
Leukocytes, UA: NEGATIVE
Nitrite: POSITIVE — AB
Specific Gravity, Urine: 1.025 (ref 1.000–1.030)
Total Protein, Urine: NEGATIVE
URINE GLUCOSE: NEGATIVE
Urobilinogen, UA: 0.2 (ref 0.0–1.0)
pH: 6 (ref 5.0–8.0)

## 2014-06-08 LAB — HIV ANTIBODY (ROUTINE TESTING W REFLEX): HIV: NONREACTIVE

## 2014-06-08 LAB — TSH: TSH: 0.6 u[IU]/mL (ref 0.35–4.50)

## 2014-06-08 LAB — CBC WITH DIFFERENTIAL/PLATELET
BASOS PCT: 0.9 % (ref 0.0–3.0)
Basophils Absolute: 0 10*3/uL (ref 0.0–0.1)
EOS ABS: 0.2 10*3/uL (ref 0.0–0.7)
Eosinophils Relative: 4.6 % (ref 0.0–5.0)
HEMATOCRIT: 38.8 % (ref 36.0–46.0)
HEMOGLOBIN: 13.1 g/dL (ref 12.0–15.0)
Lymphocytes Relative: 34.5 % (ref 12.0–46.0)
Lymphs Abs: 1.3 10*3/uL (ref 0.7–4.0)
MCHC: 33.9 g/dL (ref 30.0–36.0)
MCV: 92.3 fl (ref 78.0–100.0)
MONOS PCT: 9.2 % (ref 3.0–12.0)
Monocytes Absolute: 0.4 10*3/uL (ref 0.1–1.0)
NEUTROS ABS: 1.9 10*3/uL (ref 1.4–7.7)
Neutrophils Relative %: 50.8 % (ref 43.0–77.0)
Platelets: 278 10*3/uL (ref 150.0–400.0)
RBC: 4.2 Mil/uL (ref 3.87–5.11)
RDW: 13.8 % (ref 11.5–15.5)
WBC: 3.8 10*3/uL — ABNORMAL LOW (ref 4.0–10.5)

## 2014-06-08 LAB — BASIC METABOLIC PANEL
BUN: 10 mg/dL (ref 6–23)
CO2: 27 meq/L (ref 19–32)
Calcium: 9.2 mg/dL (ref 8.4–10.5)
Chloride: 105 mEq/L (ref 96–112)
Creatinine, Ser: 0.64 mg/dL (ref 0.40–1.20)
GFR: 133.68 mL/min (ref >60.00)
GLUCOSE: 82 mg/dL (ref 70–99)
POTASSIUM: 3.4 meq/L — AB (ref 3.5–5.1)
Sodium: 138 mEq/L (ref 135–145)

## 2014-06-08 LAB — LIPID PANEL
CHOL/HDL RATIO: 2
Cholesterol: 163 mg/dL (ref 0–200)
HDL: 88.7 mg/dL (ref >39.00)
LDL CALC: 55 mg/dL (ref 0–99)
NONHDL: 74.3
TRIGLYCERIDES: 99 mg/dL (ref 0.0–149.0)
VLDL: 19.8 mg/dL (ref 0.0–40.0)

## 2014-06-08 MED ORDER — NORETHIN-ETH ESTRAD-FE BIPHAS 1 MG-10 MCG / 10 MCG PO TABS: ORAL_TABLET | ORAL | Status: DC

## 2014-06-08 NOTE — Progress Notes (Signed)
Pre visit review using our clinic review tool, if applicable. No additional management support is needed unless otherwise documented below in the visit note. 

## 2014-06-08 NOTE — Assessment & Plan Note (Addendum)
Continue healthy diet, add regular exercise. Stop smoking, obtain routine lab work. Requests referral for baseline mammogram given family history of breast CA.  Tdap up to date, declines flu shot.

## 2014-06-08 NOTE — Progress Notes (Signed)
Subjective:    Patient ID: Elizabeth Reese, female    DOB: 14-Aug-1975, 39 y.o.   MRN: [id number]  HPI Elizabeth Reese presents today for complete physical.  Immunizations: Tdap 05/2011; Flu vaccine-declines Diet: reports healthy diet. Rare fast food. Lots of vegetables. Exercise: not exercising. Takes steps. Pap Smear: had Pap last year (2015)- was normal Mammogram: would like to schedule; maternal grandmother had breast cancer at 2 and passed recently.  1. Depression: feels that mood is good. No longer going to therapist.  2. Tobacco use: 1-2 cigarettes per day. Smokes when stressed.  3. Reports lump under chin toward neck. Noticed it a few weeks ago.   4. Doesn't sleep well. Has a hard going to sleep and staying asleep. Reports trouble 5 out of 7 nights per week. Has been going on for a year. Stopped drinking alcohol during the week and she used to fall asleep or pass out after drinking.  Denies snoring.   Review of Systems  Constitutional: Positive for diaphoresis (Reports occasional sweating at night about once per week.) and fatigue (Reports fatigue in afternoon.). Negative for fever and unexpected weight change.  HENT: Negative for congestion and rhinorrhea.   Eyes: Negative for visual disturbance.  Respiratory: Negative for cough, shortness of breath and wheezing.   Cardiovascular: Negative for chest pain, palpitations and leg swelling.  Gastrointestinal: Negative for abdominal pain, diarrhea, constipation and blood in stool.  Genitourinary: Negative for dysuria, frequency, menstrual problem and dyspareunia.  Musculoskeletal: Negative for myalgias and arthralgias.  Skin: Negative for rash.  Neurological: Negative for syncope and headaches.  Hematological: Negative for adenopathy.  Psychiatric/Behavioral:       Denies anxiety/depression.   Past Medical History  Diagnosis Date  . Migraine   . Depression     History   Social History  . Marital Status: Single   Spouse Name: N/A  . Number of Children: 0  . Years of Education: N/A   Occupational History  . Not on file.   Social History Main Topics  . Smoking status: Current Some Day Smoker -- 0.50 packs/day    Types: Cigarettes  . Smokeless tobacco: Never Used     Comment: 4 cigarettes 2-3 x weekly  . Alcohol Use: 5.0 oz/week    10 drink(s) per week  . Drug Use: No  . Sexual Activity: Not on file   Other Topics Concern  . Not on file   Social History Narrative   Caffeine use:  No   Regular exercise: no   Works as a Engineer, structural.   Single, no kids   Some college   Lots of family nearby.           History reviewed. No pertinent past surgical history.  Family History  Problem Relation Age of Onset  . Hypertension Mother   . Hypertension Maternal Aunt   . Diabetes Maternal Uncle   . Arthritis Maternal Grandmother   . Hypertension Maternal Grandmother   . Cancer Maternal Grandmother 20    breast  . Cancer Maternal Grandfather     ? liver cancer    No Known Allergies  Current Outpatient Prescriptions on File Prior to Visit  Medication Sig Dispense Refill  . naproxen sodium (ANAPROX) 220 MG tablet Take 440 mg by mouth daily as needed (pain).     No current facility-administered medications on file prior to visit.    BP 110/72 mmHg  Pulse 76  Temp(Src) 97.7 F (36.5 C) (Oral)  Resp 16  Ht 5' 4.5" (1.638 m)  Wt 146 lb 2 oz (66.282 kg)  BMI 24.70 kg/m2  SpO2 95%  LMP 06/05/2014        Objective:   Physical Exam Physical Exam  Constitutional: She is oriented to person, place, and time. She appears well-developed and well-nourished. No distress.  HENT:  Head: Normocephalic and atraumatic.  Right Ear: Tympanic membrane and ear canal normal.  Left Ear: Tympanic membrane and ear canal normal.  Mouth/Throat: Oropharynx is clear and moist.  Eyes: Pupils are equal, round, and reactive to light. No scleral icterus.  Neck: Normal range of motion.  No thyromegaly present. small pea sized non-tender submental mass most consistent with lymph node Cardiovascular: Normal rate and regular rhythm.   No murmur heard. Pulmonary/Chest: Effort normal and breath sounds normal. No respiratory distress. He has no wheezes. She has no rales. She exhibits no tenderness.  Abdominal: Soft. Bowel sounds are normal. He exhibits no distension and no mass. There is no tenderness. There is no rebound and no guarding.  Musculoskeletal: She exhibits no edema.  Lymphadenopathy:    She has no cervical adenopathy.  Neurological: She is alert and oriented to person, place, and time. She has normal patellar reflexes. She exhibits normal muscle tone. Coordination normal.  Skin: Skin is warm and dry.  Psychiatric: She has a normal mood and affect. Her behavior is normal. Judgment and thought content normal.  Breasts: Examined lying Right: Without masses, retractions, discharge or axillary adenopathy.  Left: Without masses, retractions, discharge or axillary adenopathy.          Assessment & Plan:         Assessment & Plan:

## 2014-06-08 NOTE — Assessment & Plan Note (Signed)
Stable, monitor.  °

## 2014-06-08 NOTE — Patient Instructions (Signed)
Please complete lab work prior to leaving. Schedule your mammogram on the first floor. Quit smoking- this is very important for your healthy. Try to get 30 minutes of exercise 5 days a week. Let me know if the nodule under chin enlarges. Follow up in 1 year, sooner if problems/concerns.

## 2014-06-08 NOTE — Assessment & Plan Note (Signed)
Discussed adding melatonin prn, increasing exercise.

## 2014-06-09 ENCOUNTER — Telehealth: Payer: Self-pay | Admitting: Family

## 2014-06-09 ENCOUNTER — Encounter: Payer: Self-pay | Admitting: Family

## 2014-06-09 NOTE — Telephone Encounter (Signed)
emmi emailed °

## 2015-02-02 ENCOUNTER — Other Ambulatory Visit: Payer: Self-pay | Admitting: Orthopaedic Surgery

## 2015-02-02 DIAGNOSIS — M25561 Pain in right knee: Secondary | ICD-10-CM

## 2015-02-09 ENCOUNTER — Other Ambulatory Visit: Payer: BLUE CROSS/BLUE SHIELD

## 2015-02-21 ENCOUNTER — Ambulatory Visit (INDEPENDENT_AMBULATORY_CARE_PROVIDER_SITE_OTHER): Payer: BLUE CROSS/BLUE SHIELD | Admitting: Psychiatry

## 2015-02-21 DIAGNOSIS — F332 Major depressive disorder, recurrent severe without psychotic features: Secondary | ICD-10-CM | POA: Diagnosis not present

## 2015-02-22 ENCOUNTER — Telehealth: Payer: Self-pay | Admitting: *Deleted

## 2015-02-22 ENCOUNTER — Telehealth: Payer: Self-pay | Admitting: Family

## 2015-02-22 ENCOUNTER — Encounter: Payer: Self-pay | Admitting: Family

## 2015-02-22 ENCOUNTER — Ambulatory Visit (INDEPENDENT_AMBULATORY_CARE_PROVIDER_SITE_OTHER): Payer: BLUE CROSS/BLUE SHIELD | Admitting: Family

## 2015-02-22 VITALS — BP 98/70 | HR 78 | Temp 98.1°F | Resp 16 | Ht 64.5 in | Wt 146.8 lb

## 2015-02-22 DIAGNOSIS — F329 Major depressive disorder, single episode, unspecified: Secondary | ICD-10-CM

## 2015-02-22 DIAGNOSIS — F32A Depression, unspecified: Secondary | ICD-10-CM

## 2015-02-22 MED ORDER — VENLAFAXINE HCL ER 37.5 MG PO CP24: ORAL_CAPSULE | ORAL | Status: DC

## 2015-02-22 NOTE — Assessment & Plan Note (Signed)
Uncontrolled.  Start effexor.  Pt encouraged to continue her work with her therapist. Go to the ER if she develops recurrent SI/HI- she verbalizes understanding. 15 min spent with pt today.  >50% of this time was spent counseling patient on depression.

## 2015-02-22 NOTE — Telephone Encounter (Signed)
Completed QLE PA through [facility] and they state that 60 per 30 days does not require PA.

## 2015-02-22 NOTE — Telephone Encounter (Signed)
PA for QLE initiated. Awaiting determination. JG//CMA

## 2015-02-22 NOTE — Telephone Encounter (Signed)
[facility] He states Rx still will not go through the system and may not have had time update. He will check again later and if still receiving denial he will contact the insurance.

## 2015-02-22 NOTE — Telephone Encounter (Signed)
[phone] [place] [place] - [address]  Pt called stating [facility] told her ins will not cover effexor 2/day. They will only cover 1/day but can be a higher strength. Please f/u as needed.

## 2015-02-22 NOTE — Patient Instructions (Signed)
Start effexor. Call if you have questions/concerns. Continue your work with your therapist. Go to ER if you develop recurrent thoughts of hurting yourself or others.

## 2015-02-22 NOTE — Telephone Encounter (Signed)
Opened in error

## 2015-02-22 NOTE — Progress Notes (Signed)
Pre visit review using our clinic review tool, if applicable. No additional management support is needed unless otherwise documented below in the visit note. 

## 2015-02-22 NOTE — Progress Notes (Signed)
Subjective:    Patient ID: Elizabeth Reese, female    DOB: [birth year], 39 y.o.   MRN: [id number]  HPI  Elizabeth Reese is a 39 yr old female who presents today to discuss depression. Reports feeling super eotional/feels sad.  Yesterday she could not stop crying and had to miss a work meeting.    She reports that she is having some issues with her boyfriend (has been together x 4 years but they do not live together). Not sleeping well, staying in her bed.  Denies panic attacks.  Reports that in the past she has been treated with zoloft. She was also treated with trazodone for sleep at that time.  She gained weight on the medication. Does not wish to take med which may cause her to gain weight. Denies current SI/HI. Notes that yesterday she did have SI but did not have a plan. Met with Dr. Kyra Leyland yesterday and reports that she had a very good session.  Denies any manic episodes or manic behaviors.   Review of Systems See HPI  Past Medical History  Diagnosis Date  . Migraine   . Depression     Social History   Social History  . Marital Status: Single    Spouse Name: N/A  . Number of Children: 0  . Years of Education: N/A   Occupational History  . Not on file.   Social History Main Topics  . Smoking status: Current Some Day Smoker -- 0.50 packs/day    Types: Cigarettes  . Smokeless tobacco: Never Used     Comment: 4 cigarettes 2-3 x weekly  . Alcohol Use: 5.0 oz/week    10 drink(s) per week  . Drug Use: No  . Sexual Activity: Not on file   Other Topics Concern  . Not on file   Social History Narrative   Caffeine use:  No   Regular exercise: no   Works as a Nurse, learning disability.   Single, no kids   Some college   Lots of family nearby.           No past surgical history on file.  Family History  Problem Relation Age of Onset  . Hypertension Mother   . Hypertension Maternal Aunt   . Diabetes Maternal Uncle   . Arthritis Maternal Grandmother     . Hypertension Maternal Grandmother   . Cancer Maternal Grandmother 53    breast  . Cancer Maternal Grandfather     ? liver cancer    No Known Allergies  Current Outpatient Prescriptions on File Prior to Visit  Medication Sig Dispense Refill  . Norethindrone-Ethinyl Estradiol-Fe Biphas (LO LOESTRIN FE) 1 MG-10 MCG / 10 MCG tablet TAKE 1 TABLET BY MOUTH EVERY DAY. NEEDS APPT 28 tablet 11   No current facility-administered medications on file prior to visit.    BP 98/70 mmHg  Pulse 78  Temp(Src) 98.1 F (36.7 C) (Oral)  Resp 16  Ht 5' 4.5" (1.638 m)  Wt 146 lb 12.8 oz (66.588 kg)  BMI 24.82 kg/m2  SpO2 100%       Objective:   Physical Exam  Constitutional: She is oriented to person, place, and time. She appears well-developed and well-nourished. No distress.  Musculoskeletal: She exhibits no edema.  Neurological: She is alert and oriented to person, place, and time.  Psychiatric: Her behavior is normal. Judgment and thought content normal.  Flat affect          Assessment &  Plan:

## 2015-02-23 MED ORDER — VENLAFAXINE HCL ER 37.5 MG PO CP24: ORAL_CAPSULE | ORAL | Status: DC

## 2015-02-28 ENCOUNTER — Ambulatory Visit: Payer: BLUE CROSS/BLUE SHIELD | Admitting: Psychiatry

## 2015-03-09 ENCOUNTER — Ambulatory Visit (INDEPENDENT_AMBULATORY_CARE_PROVIDER_SITE_OTHER): Payer: BLUE CROSS/BLUE SHIELD | Admitting: Psychiatry

## 2015-03-09 DIAGNOSIS — F332 Major depressive disorder, recurrent severe without psychotic features: Secondary | ICD-10-CM

## 2015-03-20 ENCOUNTER — Other Ambulatory Visit: Payer: Self-pay | Admitting: Family

## 2015-03-21 MED ORDER — VENLAFAXINE HCL ER 75 MG PO CP24: 75.0000 mg | ORAL_CAPSULE | Freq: Every day | ORAL | Status: DC

## 2015-03-21 NOTE — Telephone Encounter (Signed)
Rx sent for effexor xr 75 mg once daily.

## 2015-03-21 NOTE — Telephone Encounter (Signed)
Elizabeth Reese-- please see 02/22/15 patient email re: insurance coverage for Effexor XR. They will cover higher dose at once daily dosing and pt was asking for dose increase. Previous email said Rx was being sent but I do not see new dose?  Please advise.

## 2015-03-26 ENCOUNTER — Encounter: Payer: Self-pay | Admitting: Family

## 2015-03-27 ENCOUNTER — Ambulatory Visit: Payer: BLUE CROSS/BLUE SHIELD | Admitting: Family

## 2015-03-27 NOTE — Telephone Encounter (Signed)
Melissa-- pt sent mychart message yesterday to cancel her appt for today. Do we still charge no show fee?

## 2015-04-18 ENCOUNTER — Encounter: Payer: Self-pay | Admitting: Family

## 2015-04-18 ENCOUNTER — Ambulatory Visit (INDEPENDENT_AMBULATORY_CARE_PROVIDER_SITE_OTHER): Payer: BLUE CROSS/BLUE SHIELD | Admitting: Family

## 2015-04-18 VITALS — BP 104/72 | HR 70 | Temp 98.0°F | Resp 16 | Ht 65.0 in | Wt 156.4 lb

## 2015-04-18 DIAGNOSIS — Z0000 Encounter for general adult medical examination without abnormal findings: Secondary | ICD-10-CM

## 2015-04-18 DIAGNOSIS — R112 Nausea with vomiting, unspecified: Secondary | ICD-10-CM | POA: Diagnosis not present

## 2015-04-18 DIAGNOSIS — F329 Major depressive disorder, single episode, unspecified: Secondary | ICD-10-CM | POA: Diagnosis not present

## 2015-04-18 DIAGNOSIS — F32A Depression, unspecified: Secondary | ICD-10-CM

## 2015-04-18 MED ORDER — NORETHIN-ETH ESTRAD-FE BIPHAS 1 MG-10 MCG / 10 MCG PO TABS: ORAL_TABLET | ORAL | Status: DC

## 2015-04-18 MED ORDER — TRAZODONE HCL 50 MG PO TABS: ORAL_TABLET | ORAL | Status: DC

## 2015-04-18 MED ORDER — VENLAFAXINE HCL ER 75 MG PO CP24: 75.0000 mg | ORAL_CAPSULE | Freq: Every day | ORAL | Status: DC

## 2015-04-18 NOTE — Assessment & Plan Note (Signed)
Improving, however effexor is causing some insomnia.

## 2015-04-18 NOTE — Progress Notes (Signed)
Pre visit review using our clinic review tool, if applicable. No additional management support is needed unless otherwise documented below in the visit note. 

## 2015-04-18 NOTE — Progress Notes (Signed)
   Subjective:    Patient ID: Elizabeth Reese, female    DOB: 1975/07/26, 39 y.o.   MRN: [id number]  HPI  Elizabeth Reese is a 39 yr old female who presents today for follow up.  1) Depression- She was last seen back in October. She was started on effexor at that time. Reports feeling "wound up" and can't fall asleep.  Feels like she is better handling stress. She is taking med in AM.  Reports that she wakes up during the night. This does not occur every night.  She is taking melatonin but notes nightmares with this combination.   2) Breast tenderness/nausea- maintained on lo loestrin.  She reports good compliance.    Review of Systems See HPI  Past Medical History  Diagnosis Date  . Migraine   . Depression     Social History   Social History  . Marital Status: Single    Spouse Name: N/A  . Number of Children: 0  . Years of Education: N/A   Occupational History  . Not on file.   Social History Main Topics  . Smoking status: Current Some Day Smoker -- 0.50 packs/day    Types: Cigarettes  . Smokeless tobacco: Never Used     Comment: 4 cigarettes 2-3 x weekly  . Alcohol Use: 5.0 oz/week    10 drink(s) per week  . Drug Use: No  . Sexual Activity: Not on file   Other Topics Concern  . 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   Single, no kids   Some college   Lots of family nearby.           No past surgical history on file.  Family History  Problem Relation Age of Onset  . Hypertension Mother   . Hypertension Maternal Aunt   . Diabetes Maternal Uncle   . Arthritis Maternal Grandmother   . Hypertension Maternal Grandmother   . Cancer Maternal Grandmother [phone]    breast  . Cancer Maternal Grandfather     ? liver cancer    No Known Allergies  Current Outpatient Prescriptions on File Prior to Visit  Medication Sig Dispense Refill  . Norethindrone-Ethinyl Estradiol-Fe  Biphas (LO LOESTRIN FE) 1 MG-10 MCG / 10 MCG tablet TAKE 1 TABLET BY MOUTH EVERY DAY. NEEDS APPT 28 tablet 11   No current facility-administered medications on file prior to visit.    BP 104/72 mmHg  Pulse 70  Temp(Src) 98 F (36.7 C) (Oral)  Resp 16  Ht 5\' 5"  (1.651 m)  Wt 156 lb 6.4 oz (70.943 kg)  BMI 26.03 kg/m2  SpO2 100%       Objective:   Physical Exam  Constitutional: She is oriented to person, place, and time. She appears well-developed and well-nourished.  Musculoskeletal: She exhibits no edema.  Neurological: She is alert and oriented to person, place, and time.  Psychiatric: She has a normal mood and affect. Her behavior is normal. Judgment and thought content normal.          Assessment & Plan:  Breast Tenderness- Urine HCG negative- continue OCP. Monitor.

## 2015-04-18 NOTE — Patient Instructions (Signed)
Continue effexor. Add Trazodone 1/2- 1 tab at bedtime for sleep.

## 2015-05-06 ENCOUNTER — Other Ambulatory Visit: Payer: Self-pay | Admitting: Family

## 2015-05-22 ENCOUNTER — Ambulatory Visit (HOSPITAL_BASED_OUTPATIENT_CLINIC_OR_DEPARTMENT_OTHER)
Admission: RE | Admit: 2015-05-22 | Discharge: 2015-05-22 | Disposition: A | Discharge status: Home / Self Care | Payer: BLUE CROSS/BLUE SHIELD | Source: Ambulatory Visit | Attending: Physician Assistant | Admitting: Physician Assistant

## 2015-05-22 ENCOUNTER — Encounter: Payer: Self-pay | Admitting: Physician Assistant

## 2015-05-22 ENCOUNTER — Ambulatory Visit (INDEPENDENT_AMBULATORY_CARE_PROVIDER_SITE_OTHER): Payer: BLUE CROSS/BLUE SHIELD | Admitting: Physician Assistant

## 2015-05-22 VITALS — BP 132/82 | HR 98 | Temp 98.4°F | Resp 18 | Ht 64.5 in

## 2015-05-22 DIAGNOSIS — J00 Acute nasopharyngitis [common cold]: Secondary | ICD-10-CM

## 2015-05-22 DIAGNOSIS — R05 Cough: Secondary | ICD-10-CM | POA: Diagnosis present

## 2015-05-22 DIAGNOSIS — R918 Other nonspecific abnormal finding of lung field: Secondary | ICD-10-CM | POA: Diagnosis not present

## 2015-05-22 DIAGNOSIS — F17200 Nicotine dependence, unspecified, uncomplicated: Secondary | ICD-10-CM | POA: Diagnosis not present

## 2015-05-22 DIAGNOSIS — J208 Acute bronchitis due to other specified organisms: Secondary | ICD-10-CM

## 2015-05-22 DIAGNOSIS — B9689 Other specified bacterial agents as the cause of diseases classified elsewhere: Secondary | ICD-10-CM | POA: Insufficient documentation

## 2015-05-22 MED ORDER — HYDROCODONE-HOMATROPINE 5-1.5 MG/5ML PO SYRP
5.0000 mL | ORAL_SOLUTION | Freq: Three times a day (TID) | ORAL | Status: DC | PRN
Start: 2015-05-22 — End: 2015-05-26

## 2015-05-22 MED ORDER — AZITHROMYCIN 250 MG PO TABS: ORAL_TABLET | ORAL | Status: DC

## 2015-05-22 NOTE — Progress Notes (Signed)
Patient presents to clinic today c/o chest congestion with productive cough and intermittent fever for 4 weeks. Also endorses sinus pressure and facial pain. Denies history of asthma or allergies. Denies recent travel or sick contact at home. Has taken Sudafed and Robitussin with some relief in symptoms.   Past Medical History  Diagnosis Date  . Migraine   . Depression     Current Outpatient Prescriptions on File Prior to Visit  Medication Sig Dispense Refill  . LO LOESTRIN FE 1 MG-10 MCG / 10 MCG tablet TAKE 1 TABLET BY MOUTH EVERY DAY 28 tablet 5  . traZODone (DESYREL) 50 MG tablet 1/2- 1  tablet by mouth at bedtime. 30 tablet 2  . venlafaxine XR (EFFEXOR XR) 75 MG 24 hr capsule Take 1 capsule (75 mg total) by mouth daily with breakfast. 30 capsule 3   No current facility-administered medications on file prior to visit.    No Known Allergies  Family History  Problem Relation Age of Onset  . Hypertension Mother   . Hypertension Maternal Aunt   . Diabetes Maternal Uncle   . Arthritis Maternal Grandmother   . Hypertension Maternal Grandmother   . Cancer Maternal Grandmother 22    breast  . Cancer Maternal Grandfather     ? liver cancer    Social History   Social History  . Marital Status: Single    Spouse Name: N/A  . Number of Children: 0  . Years of Education: N/A   Social History Main Topics  . Smoking status: Current Some Day Smoker -- 0.50 packs/day    Types: Cigarettes  . Smokeless tobacco: Never Used     Comment: 4 cigarettes 2-3 x weekly  . Alcohol Use: 5.0 oz/week    10 drink(s) per week  . Drug Use: No  . Sexual Activity: Not Asked   Other Topics Concern  . None   Social History Narrative   Caffeine use:  No   Regular exercise: no   Works as a Engineer, structural.   Single, no kids   Some college   Lots of family nearby.          Review of Systems - See HPI.  All other ROS are negative.  BP 132/82 mmHg  Pulse 98  Temp(Src)  98.4 F (36.9 C) (Oral)  Resp 18  Ht 5' 4.5" (1.638 m)  SpO2 95%  Physical Exam  Constitutional: She is oriented to person, place, and time and well-developed, well-nourished, and in no distress.  HENT:  Head: Normocephalic and atraumatic.  Right Ear: External ear normal.  Left Ear: External ear normal.  Nose: Nose normal.  Mouth/Throat: Oropharynx is clear and moist. No oropharyngeal exudate.  Eyes: Conjunctivae are normal.  Neck: Neck supple.  Cardiovascular: Normal rate, regular rhythm, normal heart sounds and intact distal pulses.   Pulmonary/Chest: Effort normal. No respiratory distress. She has no wheezes. She has rales. She exhibits no tenderness.  Neurological: She is alert and oriented to person, place, and time.  Skin: Skin is warm and dry. No rash noted.  Psychiatric: Affect normal.  Vitals reviewed.   No results found for this or any previous visit (from the past 2160 hour(s)).  Assessment/Plan: Acute bacterial bronchitis Will obtain cxr to r/o pneumonia. Rx Azithromycin.  Increase fluids.  Rest.  Saline nasal spray.  Probiotic.  Mucinex as directed.  Humidifier in bedroom. Hycodan per orders.  Call or return to clinic if symptoms are not improving.

## 2015-05-22 NOTE — Patient Instructions (Signed)
Please go downstairs for x-ray. I will call you with your results.  Take antibiotic (Azithromycin) as directed.  Increase fluids.  Get plenty of rest. Use Mucinex for congestion. Hycodan as directed for cough. Take a daily probiotic (I recommend Align or Culturelle, but even Activia Yogurt may be beneficial).  A humidifier placed in the bedroom may offer some relief for a dry, scratchy throat of nasal irritation.  Read information below on acute bronchitis. Please call or return to clinic if symptoms are not improving.  Acute Bronchitis Bronchitis is when the airways that extend from the windpipe into the lungs get red, puffy, and painful (inflamed). Bronchitis often causes thick spit (mucus) to develop. This leads to a cough. A cough is the most common symptom of bronchitis. In acute bronchitis, the condition usually begins suddenly and goes away over time (usually in 2 weeks). Smoking, allergies, and asthma can make bronchitis worse. Repeated episodes of bronchitis may cause more lung problems.  HOME CARE  Rest.  Drink enough fluids to keep your pee (urine) clear or pale yellow (unless you need to limit fluids as told by your doctor).  Only take over-the-counter or prescription medicines as told by your doctor.  Avoid smoking and secondhand smoke. These can make bronchitis worse. If you are a smoker, think about using nicotine gum or skin patches. Quitting smoking will help your lungs heal faster.  Reduce the chance of getting bronchitis again by:  Washing your hands often.  Avoiding people with cold symptoms.  Trying not to touch your hands to your mouth, nose, or eyes.  Follow up with your doctor as told.  GET HELP IF: Your symptoms do not improve after 1 week of treatment. Symptoms include:  Cough.  Fever.  Coughing up thick spit.  Body aches.  Chest congestion.  Chills.  Shortness of breath.  Sore throat.  GET HELP RIGHT AWAY IF:   You have an increased  fever.  You have chills.  You have severe shortness of breath.  You have bloody thick spit (sputum).  You throw up (vomit) often.  You lose too much body fluid (dehydration).  You have a severe headache.  You faint.  MAKE SURE YOU:   Understand these instructions.  Will watch your condition.  Will get help right away if you are not doing well or get worse. Document Released: 10/02/2007 Document Revised: 12/16/2012 Document Reviewed: 10/06/2012 [facility] Patient Information 2015 [place], [place]. This information is not intended to replace advice given to you by your health care provider. Make sure you discuss any questions you have with your health care provider.

## 2015-05-22 NOTE — Progress Notes (Signed)
Pre visit review using our clinic review tool, if applicable. No additional management support is needed unless otherwise documented below in the visit note. 

## 2015-05-22 NOTE — Assessment & Plan Note (Signed)
Will obtain cxr to r/o pneumonia. Rx Azithromycin.  Increase fluids.  Rest.  Saline nasal spray.  Probiotic.  Mucinex as directed.  Humidifier in bedroom. Hycodan per orders.  Call or return to clinic if symptoms are not improving.

## 2015-05-23 ENCOUNTER — Telehealth: Payer: Self-pay | Admitting: Family

## 2015-05-23 NOTE — Telephone Encounter (Signed)
Patient returning your call.

## 2015-05-23 NOTE — Telephone Encounter (Signed)
Spoke to pt. Pt will come in and pick up from the front.

## 2015-05-23 NOTE — Telephone Encounter (Signed)
Not able to release with Cody's signature. I have reprinted the note and I am able to fax to her employer if she would like.   LVM for pt to call back as soon as possible.  RE: above statement.

## 2015-05-23 NOTE — Telephone Encounter (Signed)
Pt lost note given yesterday and asks that it is released to her mychart to print for her job.

## 2015-05-24 ENCOUNTER — Telehealth: Payer: Self-pay | Admitting: Family

## 2015-05-24 MED ORDER — LEVOFLOXACIN 750 MG PO TABS: 750.0000 mg | ORAL_TABLET | Freq: Every day | ORAL | Status: DC

## 2015-05-24 NOTE — Telephone Encounter (Signed)
Patient informed of medication instructions.  Transferred to scheduler to schedule appt for Friday. Also the patient has not been able to return to work yet due to this illness, she wanted to know if you would be able to extend her work note??  If so can just let her know on Friday.

## 2015-05-24 NOTE — Telephone Encounter (Signed)
Please call to assess --  the question is is the medication making her nauseas or are her symptoms worsening. This will change what we need to do.

## 2015-05-24 NOTE — Telephone Encounter (Signed)
Yes I will take care of that at her follow-up appointment.

## 2015-05-24 NOTE — Telephone Encounter (Signed)
Have her stop the Z-pack. Begin Levaquin daily as directed. Follow-up Friday with Melissa or myself. If anything worsens come here ASAP or go to the ER.

## 2015-05-24 NOTE — Telephone Encounter (Signed)
Patient still having breathing problems, still congested, some fever,body chills and nauseated, she stated the same symptoms as on Monday only they have worsened and thought by now she would be beginning to see some relief.

## 2015-05-24 NOTE — Telephone Encounter (Signed)
Caller name: Emma-Lee  Relationship to patient: Self  Can be reached: [phone]  Reason for call: Pt says that she was seen on Monday 1/23 and was diagnosed with pneumonia and also bacteria. Pt said that she was prescribed a medication but she's not sure if it is working because she feels sick. Pt is requesting to be advised by provider.

## 2015-05-26 ENCOUNTER — Ambulatory Visit (INDEPENDENT_AMBULATORY_CARE_PROVIDER_SITE_OTHER): Payer: BLUE CROSS/BLUE SHIELD | Admitting: Physician Assistant

## 2015-05-26 ENCOUNTER — Encounter: Payer: Self-pay | Admitting: Physician Assistant

## 2015-05-26 ENCOUNTER — Telehealth: Payer: Self-pay | Admitting: Family

## 2015-05-26 VITALS — BP 100/70 | HR 100 | Temp 97.9°F | Ht 64.5 in | Wt 153.0 lb

## 2015-05-26 DIAGNOSIS — J189 Pneumonia, unspecified organism: Secondary | ICD-10-CM

## 2015-05-26 MED ORDER — ALBUTEROL SULFATE HFA 108 (90 BASE) MCG/ACT IN AERS: 2.0000 | INHALATION_SPRAY | Freq: Four times a day (QID) | RESPIRATORY_TRACT | Status: DC | PRN

## 2015-05-26 MED ORDER — BENZONATATE 100 MG PO CAPS: 100.0000 mg | ORAL_CAPSULE | Freq: Two times a day (BID) | ORAL | Status: DC | PRN

## 2015-05-26 NOTE — Telephone Encounter (Signed)
error:[id number] ° °

## 2015-05-26 NOTE — Patient Instructions (Signed)
Please continue the Levaquin daily as directed. Increase fluids and continue Mucinex to thin mucous. Start the new cough medication (Tessalon) as directed. Use the Albuterol inhaler as directed when needed for chest tightness.  Follow-up next Wednesday as long as symptoms are improving.  Metered Dose Inhaler (No Spacer Used) Inhaled medicines are the basis of treatment for asthma and other breathing problems. Inhaled medicine can only be effective if used properly. Good technique assures that the medicine reaches the lungs. Metered dose inhalers (MDIs) are used to deliver a variety of inhaled medicines. These include quick relief or rescue medicines (such as bronchodilators) and controller medicines (such as corticosteroids). The medicine is delivered by pushing down on a metal canister to release a set amount of spray. If you are using different kinds of inhalers, use your quick relief medicine to open the airways 10-15 minutes before using a steroid, if instructed to do so by your health care provider. If you are unsure which inhalers to use and the order of using them, ask your health care provider, nurse, or respiratory therapist. HOW TO USE THE INHALER 1. Remove the cap from the inhaler. 2. If you are using the inhaler for the first time, you will need to prime it. Shake the inhaler for 5 seconds and release four puffs into the air, away from your face. Ask your health care provider or pharmacist if you have questions about priming your inhaler. 3. Shake the inhaler for 5 seconds before each breath in (inhalation). 4. Position the inhaler so that the top of the canister faces up. 5. Put your index finger on the top of the medicine canister. Your thumb supports the bottom of the inhaler. 6. Open your mouth. 7. Either place the inhaler between your teeth and place your lips tightly around the mouthpiece, or hold the inhaler 1-2 inches away from your open mouth. If you are unsure of which  technique to use, ask your health care provider. 8. Breathe out (exhale) normally and as completely as possible. 9. Press the canister down with the index finger to release the medicine. 10. At the same time as the canister is pressed, inhale deeply and slowly until your lungs are completely filled. This should take 4-6 seconds. Keep your tongue down. 11. Hold the medicine in your lungs for 5-10 seconds (10 seconds is best). This helps the medicine get into the small airways of your lungs. 12. Breathe out slowly, through pursed lips. Whistling is an example of pursed lips. 13. Wait at least 1 minute between puffs. Continue with the above steps until you have taken the number of puffs your health care provider has ordered. Do not use the inhaler more than your health care provider directs you to. 14. Replace the cap on the inhaler. 15. Follow the directions from your health care provider or the inhaler insert for cleaning the inhaler. If you are using a steroid inhaler, after your last puff, rinse your mouth with water, gargle, and spit out the water. Do not swallow the water. AVOID:  Inhaling before or after starting the spray of medicine. It takes practice to coordinate your breathing with triggering the spray.  Inhaling through the nose (rather than the mouth) when triggering the spray. HOW TO DETERMINE IF YOUR INHALER IS FULL OR NEARLY EMPTY You cannot know when an inhaler is empty by shaking it. Some inhalers are now being made with dose counters. Ask your health care provider for a prescription that has a dose counter  if you feel you need that extra help. If your inhaler does not have a counter, ask your health care provider to help you determine the date you need to refill your inhaler. Write the refill date on a calendar or your inhaler canister. Refill your inhaler 7-10 days before it runs out. Be sure to keep an adequate supply of medicine. This includes making sure it has not expired, and  making sure you have a spare inhaler. SEEK MEDICAL CARE IF:  Symptoms are only partially relieved with your inhaler.  You are having trouble using your inhaler.  You experience an increase in phlegm. SEEK IMMEDIATE MEDICAL CARE IF:  You feel little or no relief with your inhalers. You are still wheezing and feeling shortness of breath, tightness in your chest, or both.  You have dizziness, headaches, or a fast heart rate.  You have chills, fever, or night sweats.  There is a noticeable increase in phlegm production, or there is blood in the phlegm. MAKE SURE YOU:  Understand these instructions.  Will watch your condition.  Will get help right away if you are not doing well or get worse.   This information is not intended to replace advice given to you by your health care provider. Make sure you discuss any questions you have with your health care provider.   Document Released: 02/10/2007 Document Revised: 05/06/2014 Document Reviewed: 10/01/2012 Elsevier Interactive Patient Education [employer]

## 2015-05-26 NOTE — Progress Notes (Signed)
Patient presents to clinic today for follow-up of CAP diagnosed via CXR. Patient initially on Azithromycin but noted worsening symptoms after 3 days of medication. Medication was stopped and patient started on Levaquin which she has been taking daily for 2 days. Endorses improvement since change in medication. Still with productive cough, but improved. Notes fatigue and chest tightness are the two biggest issues. Denies chest pain.   Past Medical History  Diagnosis Date  . Migraine   . Depression     Current Outpatient Prescriptions on File Prior to Visit  Medication Sig Dispense Refill  . levofloxacin (LEVAQUIN) 750 MG tablet Take 1 tablet (750 mg total) by mouth daily. 7 tablet 0  . LO LOESTRIN FE 1 MG-10 MCG / 10 MCG tablet TAKE 1 TABLET BY MOUTH EVERY DAY 28 tablet 5  . traZODone (DESYREL) 50 MG tablet 1/2- 1  tablet by mouth at bedtime. 30 tablet 2  . venlafaxine XR (EFFEXOR XR) 75 MG 24 hr capsule Take 1 capsule (75 mg total) by mouth daily with breakfast. 30 capsule 3   No current facility-administered medications on file prior to visit.    Allergies  Allergen Reactions  . Azithromycin Itching and Rash    Family History  Problem Relation Age of Onset  . Hypertension Mother   . Hypertension Maternal Aunt   . Diabetes Maternal Uncle   . Arthritis Maternal Grandmother   . Hypertension Maternal Grandmother   . Cancer Maternal Grandmother 80    breast  . Cancer Maternal Grandfather     ? liver cancer    Social History   Social History  . Marital Status: Single    Spouse Name: N/A  . Number of Children: 0  . Years of Education: N/A   Social History Main Topics  . Smoking status: Current Some Day Smoker -- 0.50 packs/day    Types: Cigarettes  . Smokeless tobacco: Never Used     Comment: 4 cigarettes 2-3 x weekly  . Alcohol Use: 5.0 oz/week    10 drink(s) per week  . Drug Use: No  . Sexual Activity: Not Asked   Other Topics Concern  . None   Social History  Narrative   Caffeine use:  No   Regular exercise: no   Works as a Engineer, structural.   Single, no kids   Some college   Lots of family nearby.          Review of Systems - See HPI.  All other ROS are negative.  BP 100/70 mmHg  Pulse 100  Temp(Src) 97.9 F (36.6 C) (Oral)  Ht 5' 4.5" (1.638 m)  Wt 153 lb (69.4 kg)  BMI 25.87 kg/m2  SpO2 99%  Physical Exam  Constitutional: She is well-developed, well-nourished, and in no distress.  HENT:  Head: Normocephalic and atraumatic.  Right Ear: External ear normal.  Left Ear: External ear normal.  Nose: Nose normal.  Mouth/Throat: Oropharynx is clear and moist. No oropharyngeal exudate.  Eyes: Conjunctivae are normal.  Neck: Neck supple.  Cardiovascular: Normal rate, regular rhythm, normal heart sounds and intact distal pulses.   Pulmonary/Chest: Effort normal and breath sounds normal. No respiratory distress. She has no wheezes. She has no rales. She exhibits no tenderness.  Lymphadenopathy:    She has no cervical adenopathy.  Skin: Skin is warm and dry. No rash noted.  Psychiatric: Affect normal.  Vitals reviewed.   No results found for this or any previous visit (from the past  2160 hour(s)).  Assessment/Plan: CAP (community acquired pneumonia) Will continue Levaquin. Continue supportive measures as directed. Will begin Albuterol as directed. Follow-up next week as scheduled. ER if anything worsens over the weekend.

## 2015-05-26 NOTE — Progress Notes (Signed)
Pre visit review using our clinic review tool, if applicable. No additional management support is needed unless otherwise documented below in the visit note. 

## 2015-05-28 DIAGNOSIS — J189 Pneumonia, unspecified organism: Secondary | ICD-10-CM | POA: Insufficient documentation

## 2015-05-28 NOTE — Assessment & Plan Note (Signed)
Will continue Levaquin. Continue supportive measures as directed. Will begin Albuterol as directed. Follow-up next week as scheduled. ER if anything worsens over the weekend.

## 2015-05-31 ENCOUNTER — Other Ambulatory Visit: Payer: Self-pay | Admitting: Physician Assistant

## 2015-05-31 ENCOUNTER — Ambulatory Visit (INDEPENDENT_AMBULATORY_CARE_PROVIDER_SITE_OTHER): Payer: BLUE CROSS/BLUE SHIELD | Admitting: Physician Assistant

## 2015-05-31 ENCOUNTER — Encounter: Payer: Self-pay | Admitting: Physician Assistant

## 2015-05-31 ENCOUNTER — Ambulatory Visit (HOSPITAL_BASED_OUTPATIENT_CLINIC_OR_DEPARTMENT_OTHER)
Admission: RE | Admit: 2015-05-31 | Discharge: 2015-05-31 | Disposition: A | Discharge status: Home / Self Care | Payer: BLUE CROSS/BLUE SHIELD | Source: Ambulatory Visit | Attending: Physician Assistant | Admitting: Physician Assistant

## 2015-05-31 VITALS — BP 110/78 | HR 84 | Temp 97.8°F | Ht 64.5 in | Wt 152.6 lb

## 2015-05-31 DIAGNOSIS — S6721XA Crushing injury of right hand, initial encounter: Secondary | ICD-10-CM | POA: Diagnosis not present

## 2015-05-31 DIAGNOSIS — S6991XA Unspecified injury of right wrist, hand and finger(s), initial encounter: Secondary | ICD-10-CM | POA: Diagnosis present

## 2015-05-31 DIAGNOSIS — J189 Pneumonia, unspecified organism: Secondary | ICD-10-CM

## 2015-05-31 DIAGNOSIS — M7989 Other specified soft tissue disorders: Secondary | ICD-10-CM | POA: Diagnosis not present

## 2015-05-31 DIAGNOSIS — X58XXXA Exposure to other specified factors, initial encounter: Secondary | ICD-10-CM | POA: Insufficient documentation

## 2015-05-31 MED ORDER — LEVOFLOXACIN 750 MG PO TABS: 750.0000 mg | ORAL_TABLET | Freq: Every day | ORAL | Status: DC

## 2015-05-31 NOTE — Progress Notes (Signed)
Patient presents to clinic today for subsequent follow-up of community-acquired pneumonia.  Has continued Levaquin daily as directed and notes feeling much better. No recurrence of fever. Still notes mild chest congestion with cough that is improving. Still having some mild chest tightness. Is using inhaler as directed to help with chest tightness.  Past Medical History  Diagnosis Date  . Migraine   . Depression     Current Outpatient Prescriptions on File Prior to Visit  Medication Sig Dispense Refill  . albuterol (PROVENTIL HFA;VENTOLIN HFA) 108 (90 Base) MCG/ACT inhaler Inhale 2 puffs into the lungs every 6 (six) hours as needed for wheezing or shortness of breath. 1 Inhaler 0  . benzonatate (TESSALON) 100 MG capsule Take 1 capsule (100 mg total) by mouth 2 (two) times daily as needed for cough. 20 capsule 0  . LO LOESTRIN FE 1 MG-10 MCG / 10 MCG tablet TAKE 1 TABLET BY MOUTH EVERY DAY 28 tablet 5  . venlafaxine XR (EFFEXOR XR) 75 MG 24 hr capsule Take 1 capsule (75 mg total) by mouth daily with breakfast. 30 capsule 3  . traZODone (DESYREL) 50 MG tablet 1/2- 1  tablet by mouth at bedtime. (Patient not taking: Reported on 05/31/2015) 30 tablet 2   No current facility-administered medications on file prior to visit.    Allergies  Allergen Reactions  . Azithromycin Itching and Rash    Family History  Problem Relation Age of Onset  . Hypertension Mother   . Hypertension Maternal Aunt   . Diabetes Maternal Uncle   . Arthritis Maternal Grandmother   . Hypertension Maternal Grandmother   . Cancer Maternal Grandmother 48    breast  . Cancer Maternal Grandfather     ? liver cancer    Social History   Social History  . Marital Status: Single    Spouse Name: N/A  . Number of Children: 0  . Years of Education: N/A   Social History Main Topics  . Smoking status: Current Some Day Smoker -- 0.50 packs/day    Types: Cigarettes  . Smokeless tobacco: Never Used     Comment: 4  cigarettes 2-3 x weekly  . Alcohol Use: 5.0 oz/week    10 drink(s) per week  . Drug Use: No  . Sexual Activity: Not Asked   Other Topics Concern  . None   Social History Narrative   Caffeine use:  No   Regular exercise: no   Works as a Engineer, structural.   Single, no kids   Some college   Lots of family nearby.          Review of Systems - See HPI.  All other ROS are negative.  BP 110/78 mmHg  Pulse 84  Temp(Src) 97.8 F (36.6 C) (Oral)  Ht 5' 4.5" (1.638 m)  Wt 152 lb 9.6 oz (69.219 kg)  BMI 25.80 kg/m2  SpO2 100%  Physical Exam  Constitutional: She is oriented to person, place, and time and well-developed, well-nourished, and in no distress.  HENT:  Head: Normocephalic and atraumatic.  Eyes: Conjunctivae are normal.  Cardiovascular: Normal rate, regular rhythm, normal heart sounds and intact distal pulses.   Pulmonary/Chest: Effort normal and breath sounds normal. No respiratory distress. She has no wheezes. She has no rales. She exhibits no tenderness.  Neurological: She is alert and oriented to person, place, and time.  Skin: Skin is warm and dry. No rash noted.  Psychiatric: Affect normal.  Vitals reviewed.  No results  found for this or any previous visit (from the past 2160 hour(s)).  Assessment/Plan: CAP (community acquired pneumonia) Improving. Will extend course of Levaquin for 3 more days. Continue supportive measures. Follow-up if symptoms are not continuing to resolve.

## 2015-05-31 NOTE — Patient Instructions (Signed)
Please finish the extended course of antibiotic. Continue hydration, rest and Mucinex-DM. Use inhaler as directed. Symptoms should only continue to resolve from here. If anything recurs please come see [place].  Community-Acquired Pneumonia, Adult Pneumonia is an infection of the lungs. One type of pneumonia can happen while a person is in a hospital. A different type can happen when a person is not in a hospital (community-acquired pneumonia). It is easy for this kind to spread from person to person. It can spread to you if you breathe near an infected person who coughs or sneezes. Some symptoms include:  A dry cough.  A wet (productive) cough.  Fever.  Sweating.  Chest pain. HOME CARE  Take over-the-counter and prescription medicines only as told by your doctor.  Only take cough medicine if you are losing sleep.  If you were prescribed an antibiotic medicine, take it as told by your doctor. Do not stop taking the antibiotic even if you start to feel better.  Sleep with your head and neck raised (elevated). You can do this by putting a few pillows under your head, or you can sleep in a recliner.  Do not use tobacco products. These include cigarettes, chewing tobacco, and e-cigarettes. If you need help quitting, ask your doctor.  Drink enough water to keep your pee (urine) clear or pale yellow. A shot (vaccine) can help prevent pneumonia. Shots are often suggested for:  People older than 39 years of age.  People older than 39 years of age:  Who are having cancer treatment.  Who have long-term (chronic) lung disease.  Who have problems with their body's defense system (immune system). You may also prevent pneumonia if you take these actions:  Get the flu (influenza) shot every year.  Go to the dentist as often as told.  Wash your hands often. If soap and water are not available, use hand sanitizer. GET HELP IF:  You have a fever.  You lose sleep because your cough  medicine does not help. GET HELP RIGHT AWAY IF:  You are short of breath and it gets worse.  You have more chest pain.  Your sickness gets worse. This is very serious if:  You are an older adult.  Your body's defense system is weak.  You cough up blood.   This information is not intended to replace advice given to you by your health care provider. Make sure you discuss any questions you have with your health care provider.   Document Released: 10/02/2007 Document Revised: 01/04/2015 Document Reviewed: 08/10/2014 Elsevier Interactive Patient Education [employer]

## 2015-05-31 NOTE — Progress Notes (Signed)
Pre visit review using our clinic review tool, if applicable. No additional management support is needed unless otherwise documented below in the visit note. 

## 2015-05-31 NOTE — Assessment & Plan Note (Signed)
Improving. Will extend course of Levaquin for 3 more days. Continue supportive measures. Follow-up if symptoms are not continuing to resolve.

## 2015-06-08 ENCOUNTER — Ambulatory Visit (HOSPITAL_BASED_OUTPATIENT_CLINIC_OR_DEPARTMENT_OTHER)
Admission: RE | Admit: 2015-06-08 | Discharge: 2015-06-08 | Disposition: A | Discharge status: Home / Self Care | Payer: BLUE CROSS/BLUE SHIELD | Source: Ambulatory Visit | Attending: Family Medicine | Admitting: Family Medicine

## 2015-06-08 ENCOUNTER — Telehealth: Payer: Self-pay | Admitting: Physician Assistant

## 2015-06-08 ENCOUNTER — Ambulatory Visit (INDEPENDENT_AMBULATORY_CARE_PROVIDER_SITE_OTHER): Payer: BLUE CROSS/BLUE SHIELD | Admitting: Family Medicine

## 2015-06-08 ENCOUNTER — Encounter: Payer: Self-pay | Admitting: Family Medicine

## 2015-06-08 VITALS — BP 110/72 | HR 74 | Temp 97.9°F | Ht 64.5 in | Wt 155.4 lb

## 2015-06-08 DIAGNOSIS — M79661 Pain in right lower leg: Secondary | ICD-10-CM | POA: Insufficient documentation

## 2015-06-08 DIAGNOSIS — M7989 Other specified soft tissue disorders: Secondary | ICD-10-CM | POA: Diagnosis not present

## 2015-06-08 DIAGNOSIS — M7120 Synovial cyst of popliteal space [Baker], unspecified knee: Secondary | ICD-10-CM | POA: Insufficient documentation

## 2015-06-08 DIAGNOSIS — M79604 Pain in right leg: Secondary | ICD-10-CM | POA: Diagnosis not present

## 2015-06-08 DIAGNOSIS — M7122 Synovial cyst of popliteal space [Baker], left knee: Secondary | ICD-10-CM | POA: Diagnosis not present

## 2015-06-08 MED ORDER — HYDROCODONE-ACETAMINOPHEN 5-325 MG PO TABS: 1.0000 | ORAL_TABLET | Freq: Four times a day (QID) | ORAL | Status: DC | PRN

## 2015-06-08 NOTE — Progress Notes (Signed)
Patient ID: [name], female    DOB: 24-Dec-1975  Age: 40 y.o. MRN: [id number]    Subjective:  Subjective HPI JONIQUE KULIG presents for r calf and thigh pain and swelling after being in bed with pneumonia for a while.  No sob, no cp.    Review of Systems  Constitutional: Negative for diaphoresis, appetite change, fatigue and unexpected weight change.  Eyes: Negative for pain, redness and visual disturbance.  Respiratory: Negative for cough, chest tightness, shortness of breath and wheezing.   Cardiovascular: Positive for leg swelling. Negative for chest pain and palpitations.  Endocrine: Negative for cold intolerance, heat intolerance, polydipsia, polyphagia and polyuria.  Genitourinary: Negative for dysuria, frequency and difficulty urinating.  Musculoskeletal: Positive for joint swelling and arthralgias.  Neurological: Negative for dizziness, light-headedness, numbness and headaches.    History Past Medical History  Diagnosis Date  . Migraine   . Depression     She has no past surgical history on file.   Her family history includes Arthritis in her maternal grandmother; Cancer in her maternal grandfather; Cancer (age of onset: 56) in her maternal grandmother; Diabetes in her maternal uncle; Hypertension in her maternal aunt, maternal grandmother, and mother.She reports that she has been smoking Cigarettes.  She has been smoking about 0.50 packs per day. She has never used smokeless tobacco. She reports that she drinks about 5.0 oz of alcohol per week. She reports that she does not use illicit drugs.  Current Outpatient Prescriptions on File Prior to Visit  Medication Sig Dispense Refill  . albuterol (PROVENTIL HFA;VENTOLIN HFA) 108 (90 Base) MCG/ACT inhaler Inhale 2 puffs into the lungs every 6 (six) hours as needed for wheezing or shortness of breath. 1 Inhaler 0  . benzonatate (TESSALON) 100 MG capsule Take 1 capsule (100 mg total) by mouth 2 (two) times daily as  needed for cough. 20 capsule 0  . levofloxacin (LEVAQUIN) 750 MG tablet Take 1 tablet (750 mg total) by mouth daily. 3 tablet 0  . LO LOESTRIN FE 1 MG-10 MCG / 10 MCG tablet TAKE 1 TABLET BY MOUTH EVERY DAY 28 tablet 5  . traZODone (DESYREL) 50 MG tablet 1/2- 1  tablet by mouth at bedtime. (Patient not taking: Reported on 05/31/2015) 30 tablet 2  . venlafaxine XR (EFFEXOR XR) 75 MG 24 hr capsule Take 1 capsule (75 mg total) by mouth daily with breakfast. 30 capsule 3   No current facility-administered medications on file prior to visit.     Objective:  Objective Physical Exam  Constitutional: She is oriented to person, place, and time. She appears well-developed and well-nourished.  HENT:  Head: Normocephalic and atraumatic.  Eyes: Conjunctivae and EOM are normal.  Neck: Normal range of motion. Neck supple. No JVD present. Carotid bruit is not present. No thyromegaly present.  Cardiovascular: Normal rate, regular rhythm and normal heart sounds.   No murmur heard. Pulmonary/Chest: Effort normal and breath sounds normal. No respiratory distress. She has no wheezes. She has no rales. She exhibits no tenderness.  Musculoskeletal: She exhibits edema and tenderness.       Right knee: She exhibits decreased range of motion and swelling. Tenderness found.       Right upper leg: She exhibits tenderness and swelling.       Right lower leg: She exhibits tenderness and swelling.  Neurological: She is alert and oriented to person, place, and time.  Psychiatric: She has a normal mood and affect.   BP 110/72 mmHg  Pulse 74  Temp(Src) 97.9 F (36.6 C) (Oral)  Ht 5' 4.5" (1.638 m)  Wt 155 lb 6.4 oz (70.489 kg)  BMI 26.27 kg/m2  SpO2 98% Wt Readings from Last 3 Encounters:  06/08/15 155 lb 6.4 oz (70.489 kg)  05/31/15 152 lb 9.6 oz (69.219 kg)  05/26/15 153 lb (69.4 kg)     Lab Results  Component Value Date   WBC 3.8* 06/08/2014   HGB 13.1 06/08/2014   HCT 38.8 06/08/2014   PLT 278.0  06/08/2014   GLUCOSE 82 06/08/2014   CHOL 163 06/08/2014   TRIG 99.0 06/08/2014   HDL 88.70 06/08/2014   LDLCALC 55 06/08/2014   ALT 15 06/08/2014   AST 20 06/08/2014   NA 138 06/08/2014   K 3.4* 06/08/2014   CL 105 06/08/2014   CREATININE 0.64 06/08/2014   BUN 10 06/08/2014   CO2 27 06/08/2014   TSH 0.60 06/08/2014    Dg Hand Complete Right  05/31/2015  CLINICAL DATA:  Crush injury of the lateral aspect of the right hand in the fifth metacarpal region 2 weeks ago; persistent pain; history of fifth metacarpal fracture 1 year ago. EXAM: RIGHT HAND - COMPLETE 3+ VIEW COMPARISON:  Right hand series of Sep 13, 2013. FINDINGS: The bones of the hand are adequately mineralized. There is subtle old deformity of the distal aspect of the shaft of the fifth metacarpal. There is no acute fracture. There is mild diffuse interphalangeal joint space narrowing. The MCP joints appear normal. The carpal bones are intact where visualized. There is soft tissue swelling over the fifth metacarpal. IMPRESSION: There are chronic changes of the distal aspect of the fifth metacarpal consistent with previous injury. There is overlying soft tissue swelling which is likely acute. No acute fracture nor dislocation is observed. Electronically Signed   By: David  [place] M.D.   On: 05/31/2015 10:36     Assessment & Plan:  Plan I am having Ms. Forrey start on HYDROcodone-acetaminophen. I am also having her maintain her venlafaxine XR, traZODone, LO LOESTRIN FE, benzonatate, albuterol, and levofloxacin.  Meds ordered this encounter  Medications  . HYDROcodone-acetaminophen (NORCO/VICODIN) 5-325 MG tablet    Sig: Take 1 tablet by mouth every 6 (six) hours as needed for moderate pain.    Dispense:  30 tablet    Refill:  0    Problem List Items Addressed This Visit      Unprioritized   Right calf pain - Primary    Doppler tonight If dvt + start xaralto 15 mg bid for 3 weeks then 20 mg qd---  With referral to  heme If no dvt --pt has f/u with ortho for mri and possible surgery for knee      Relevant Medications   HYDROcodone-acetaminophen (NORCO/VICODIN) 5-325 MG tablet   Other Relevant Orders   US Venous Img Lower Unilateral Right      Follow-up: Return if symptoms worsen or fail to improve.  Loreen Freud, DO

## 2015-06-08 NOTE — Telephone Encounter (Signed)
Patient Name: Elizabeth Reese  DOB: 03-05-1976    Initial Comment Caller states she was DX with pneumonia last week. She's having shooting pains in her leg. Pain behind her knee. Swelling in all of her leg. Leg is very tight, she can't bend it. Some back and hand pain.   Nurse Assessment  Nurse: Stefano Gaul, RN, Dwana Curd Date/Time (Eastern Time): 06/08/2015 10:36:57 AM  Confirm and document reason for call. If symptomatic, describe symptoms. You must click the next button to save text entered. ---Caller states she was diagnosed with pneumonia last week. her right leg is swollen up to her groin. Hard for her to walk. She has pain in that leg also behind her right knee.  Has the patient traveled out of the country within the last 30 days? ---Not Applicable  Does the patient have any new or worsening symptoms? ---Yes  Will a triage be completed? ---Yes  Related visit to physician within the last 2 weeks? ---No  Does the PT have any chronic conditions? (i.e. diabetes, asthma, etc.) ---No  Did the patient indicate they were pregnant? ---No  Is this a behavioral health or substance abuse call? ---No     Guidelines    Guideline Title Affirmed Question Affirmed Notes  Leg Swelling and Edema SEVERE leg swelling (e.g., swelling extends above knee, entire leg is swollen, weeping fluid)    Final Disposition User   See Physician within 4 Hours (or PCP triage) Stefano Gaul, RN, Vera    Comments  Appt already scheduled for 06/08/15 at 4:15 pm with Loreen Freud by the office. No other appts available. She is at work and does not want to go to the ER or urgent care or another office  She is currently taking Levaquin for her pneumonia   Referrals  GO TO FACILITY REFUSED   Disagree/Comply: Disagree  Disagree/Comply Reason: Disagree with instructions

## 2015-06-08 NOTE — Patient Instructions (Signed)
If the doppler comes back positive for a clot--- start the xaralto  2x a day--- we will call you with further instructions

## 2015-06-08 NOTE — Progress Notes (Signed)
Pre visit review using our clinic review tool, if applicable. No additional management support is needed unless otherwise documented below in the visit note. 

## 2015-06-08 NOTE — Assessment & Plan Note (Signed)
Doppler tonight If dvt + start xaralto 15 mg bid for 3 weeks then 20 mg qd---  With referral to heme If no dvt --pt has f/u with ortho for mri and possible surgery for knee

## 2015-06-12 ENCOUNTER — Ambulatory Visit
Admission: RE | Admit: 2015-06-12 | Discharge: 2015-06-12 | Disposition: A | Discharge status: Home / Self Care | Payer: BLUE CROSS/BLUE SHIELD | Source: Ambulatory Visit | Attending: Orthopaedic Surgery | Admitting: Orthopaedic Surgery

## 2015-06-12 DIAGNOSIS — M25561 Pain in right knee: Secondary | ICD-10-CM

## 2015-06-26 ENCOUNTER — Encounter: Payer: Self-pay | Admitting: Behavioral Health

## 2015-06-26 ENCOUNTER — Telehealth: Payer: Self-pay | Admitting: Behavioral Health

## 2015-06-26 NOTE — Telephone Encounter (Signed)
Pre-Visit Call completed with patient and chart updated.   Pre-Visit Info documented in Specialty Comments under [employer].    

## 2015-06-28 ENCOUNTER — Encounter: Payer: Self-pay | Admitting: Family

## 2015-06-28 ENCOUNTER — Ambulatory Visit (INDEPENDENT_AMBULATORY_CARE_PROVIDER_SITE_OTHER): Payer: BLUE CROSS/BLUE SHIELD | Admitting: Family

## 2015-06-28 VITALS — BP 105/70 | HR 70 | Temp 97.9°F | Resp 16 | Ht 64.5 in | Wt 154.8 lb

## 2015-06-28 DIAGNOSIS — Z0000 Encounter for general adult medical examination without abnormal findings: Secondary | ICD-10-CM

## 2015-06-28 DIAGNOSIS — M7120 Synovial cyst of popliteal space [Baker], unspecified knee: Secondary | ICD-10-CM

## 2015-06-28 LAB — LIPID PANEL
Cholesterol: 160 mg/dL (ref 0–200)
HDL: 105 mg/dL (ref >39.00)
LDL Cholesterol: 40 mg/dL (ref 0–99)
NONHDL: 55.21
TRIGLYCERIDES: 76 mg/dL (ref 0.0–149.0)
Total CHOL/HDL Ratio: 2
VLDL: 15.2 mg/dL (ref 0.0–40.0)

## 2015-06-28 LAB — URINALYSIS, ROUTINE W REFLEX MICROSCOPIC
BILIRUBIN URINE: NEGATIVE
KETONES UR: NEGATIVE
LEUKOCYTES UA: NEGATIVE
NITRITE: NEGATIVE
Specific Gravity, Urine: 1.025 (ref 1.000–1.030)
Total Protein, Urine: NEGATIVE
UROBILINOGEN UA: 0.2 (ref 0.0–1.0)
Urine Glucose: NEGATIVE
pH: 6 (ref 5.0–8.0)

## 2015-06-28 LAB — HEPATIC FUNCTION PANEL
ALBUMIN: 3.9 g/dL (ref 3.5–5.2)
ALK PHOS: 42 U/L (ref 39–117)
ALT: 11 U/L (ref 0–35)
AST: 17 U/L (ref 0–37)
Bilirubin, Direct: 0.1 mg/dL (ref 0.0–0.3)
TOTAL PROTEIN: 6.9 g/dL (ref 6.0–8.3)
Total Bilirubin: 0.4 mg/dL (ref 0.2–1.2)

## 2015-06-28 LAB — CBC WITH DIFFERENTIAL/PLATELET
BASOS ABS: 0 10*3/uL (ref 0.0–0.1)
BASOS PCT: 1 % (ref 0.0–3.0)
EOS ABS: 0.2 10*3/uL (ref 0.0–0.7)
Eosinophils Relative: 6.5 % — ABNORMAL HIGH (ref 0.0–5.0)
HEMATOCRIT: 36.6 % (ref 36.0–46.0)
Hemoglobin: 12.3 g/dL (ref 12.0–15.0)
LYMPHS ABS: 1.5 10*3/uL (ref 0.7–4.0)
Lymphocytes Relative: 43 % (ref 12.0–46.0)
MCHC: 33.7 g/dL (ref 30.0–36.0)
MCV: 93.6 fl (ref 78.0–100.0)
MONOS PCT: 8 % (ref 3.0–12.0)
Monocytes Absolute: 0.3 10*3/uL (ref 0.1–1.0)
NEUTROS ABS: 1.5 10*3/uL (ref 1.4–7.7)
NEUTROS PCT: 41.5 % — AB (ref 43.0–77.0)
PLATELETS: 279 10*3/uL (ref 150.0–400.0)
RBC: 3.91 Mil/uL (ref 3.87–5.11)
RDW: 14.1 % (ref 11.5–15.5)
WBC: 3.6 10*3/uL — ABNORMAL LOW (ref 4.0–10.5)

## 2015-06-28 LAB — BASIC METABOLIC PANEL
BUN: 9 mg/dL (ref 6–23)
CALCIUM: 9.2 mg/dL (ref 8.4–10.5)
CHLORIDE: 104 meq/L (ref 96–112)
CO2: 28 meq/L (ref 19–32)
CREATININE: 0.72 mg/dL (ref 0.40–1.20)
GFR: 116.04 mL/min (ref >60.00)
GLUCOSE: 79 mg/dL (ref 70–99)
Potassium: 3.7 mEq/L (ref 3.5–5.1)
Sodium: 138 mEq/L (ref 135–145)

## 2015-06-28 LAB — TSH: TSH: 1.28 u[IU]/mL (ref 0.35–4.50)

## 2015-06-28 MED ORDER — NORETHIN-ETH ESTRAD-FE BIPHAS 1 MG-10 MCG / 10 MCG PO TABS: 1.0000 | ORAL_TABLET | Freq: Every day | ORAL | Status: DC

## 2015-06-28 NOTE — Progress Notes (Signed)
Pre visit review using our clinic review tool, if applicable. No additional management support is needed unless otherwise documented below in the visit note. 

## 2015-06-28 NOTE — Assessment & Plan Note (Signed)
I advised pt to follow back up with Dr. Cleophas Dunker for ongoing management of knee pain.

## 2015-06-28 NOTE — Progress Notes (Signed)
Subjective:    Patient ID: Elizabeth Reese, female    DOB: 09/06/1975, 40 y.o.   MRN: [id number]  HPI  Patient presents today for complete physical.  Immunizations: Tdap up to date Diet: fair diet Wt Readings from Last 3 Encounters:  06/28/15 154 lb 12.8 oz (70.217 kg)  06/08/15 155 lb 6.4 oz (70.489 kg)  05/31/15 152 lb 9.6 oz (69.219 kg)  Exercise: not exercising Pap Smear: 2015- GYN mammo- GM had breast CA, pt requests mammo Dental: due      Review of Systems  Constitutional: Negative for fatigue and unexpected weight change.  HENT: Positive for rhinorrhea. Negative for hearing loss and sore throat.   Eyes: Negative for visual disturbance.  Respiratory: Negative for cough.   Cardiovascular: Negative for chest pain and palpitations.  Gastrointestinal: Negative for nausea, diarrhea and constipation.  Genitourinary: Negative for dysuria and frequency.  Musculoskeletal:       Pain right leg from baker's cyst  Skin: Negative for rash.  Neurological: Negative for headaches.  Hematological: Negative for adenopathy.  Psychiatric/Behavioral:       Denies anxiety/depression       Past Medical History  Diagnosis Date  . Migraine   . Depression   . Baker's cyst of knee     Social History   Social History  . Marital Status: Single    Spouse Name: N/A  . Number of Children: 0  . Years of Education: N/A   Occupational History  . Not on file.   Social History Main Topics  . Smoking status: Current Some Day Smoker -- 0.50 packs/day    Types: Cigarettes  . Smokeless tobacco: Never Used     Comment: 4 cigarettes 2-3 x weekly  . Alcohol Use: 6.0 oz/week    10 Standard drinks or equivalent per week  . Drug Use: No  . Sexual Activity: Not on file   Other Topics Concern  . Not on file   Social History Narrative   Caffeine use:  No   Regular exercise: no   Works as a Engineer, structural.   Single, no kids   Some college   Lots of family nearby.            No past surgical history on file.  Family History  Problem Relation Age of Onset  . Hypertension Mother   . Hypertension Maternal Aunt   . Diabetes Maternal Uncle   . Arthritis Maternal Grandmother   . Hypertension Maternal Grandmother   . Cancer Maternal Grandmother 43    breast  . Cancer Maternal Grandfather     ? liver cancer    Allergies  Allergen Reactions  . Azithromycin Itching and Rash    Current Outpatient Prescriptions on File Prior to Visit  Medication Sig Dispense Refill  . albuterol (PROVENTIL HFA;VENTOLIN HFA) 108 (90 Base) MCG/ACT inhaler Inhale 2 puffs into the lungs every 6 (six) hours as needed for wheezing or shortness of breath. 1 Inhaler 0  . HYDROcodone-acetaminophen (NORCO/VICODIN) 5-325 MG tablet Take 1 tablet by mouth every 6 (six) hours as needed for moderate pain. 30 tablet 0  . LO LOESTRIN FE 1 MG-10 MCG / 10 MCG tablet TAKE 1 TABLET BY MOUTH EVERY DAY 28 tablet 5   No current facility-administered medications on file prior to visit.    BP 105/70 mmHg  Pulse 70  Temp(Src) 97.9 F (36.6 C) (Oral)  Resp 16  Ht 5' 4.5" (1.638 m)  Wt 154 lb  12.8 oz (70.217 kg)  BMI 26.17 kg/m2  SpO2 100%    Objective:   Physical Exam Physical Exam  Constitutional: She is oriented to person, place, and time. She appears well-developed and well-nourished. No distress.  HENT:  Head: Normocephalic and atraumatic.  Right Ear: Tympanic membrane and ear canal normal.  Left Ear: Tympanic membrane and ear canal normal.  Mouth/Throat: Oropharynx is clear and moist.  Eyes: Pupils are equal, round, and reactive to light. No scleral icterus.  Neck: Normal range of motion. No thyromegaly present.  Cardiovascular: Normal rate and regular rhythm.   No murmur heard. Pulmonary/Chest: Effort normal and breath sounds normal. No respiratory distress. He has no wheezes. She has no rales. She exhibits no tenderness.  Abdominal: Soft. Bowel sounds are normal. He  exhibits no distension and no mass. There is no tenderness. There is no rebound and no guarding.  Musculoskeletal: She exhibits no edema.  Lymphadenopathy:    She has no cervical adenopathy.  Neurological: She is alert and oriented to person, place, and time. She has normal patellar reflexes. She exhibits normal muscle tone. Coordination normal.  Skin: Skin is warm and dry.  Psychiatric: She has a normal mood and affect. Her behavior is normal. Judgment and thought content normal.  Breasts: Examined lying Right: Without masses, retractions, discharge or axillary adenopathy.  Left: Without masses, retractions, discharge or axillary adenopathy.  Pelvic:  deferred       Assessment & Plan:         Assessment & Plan:

## 2015-06-28 NOTE — Assessment & Plan Note (Signed)
Discussed healthy diet, exercise.  Advised to arrange dental appointment. Referral placed for mammo. tdap up to date.

## 2015-06-28 NOTE — Patient Instructions (Addendum)
Please complete lab work prior to leaving.  Try to add regular exercise.  

## 2015-06-29 LAB — HIV ANTIBODY (ROUTINE TESTING W REFLEX): HIV: NONREACTIVE

## 2015-06-29 LAB — GC/CHLAMYDIA PROBE AMP
CT PROBE, AMP APTIMA: NOT DETECTED
GC Probe RNA: NOT DETECTED

## 2015-06-30 ENCOUNTER — Encounter: Payer: Self-pay | Admitting: Family

## 2016-02-05 ENCOUNTER — Ambulatory Visit: Payer: Self-pay | Admitting: Family

## 2016-02-05 ENCOUNTER — Telehealth: Payer: Self-pay | Admitting: Family

## 2016-02-05 NOTE — Telephone Encounter (Signed)
Patient left message on voicemail @ 7:24 this morning stating that she could not make her 7:45 appointment. Charge or No Charge?

## 2016-02-05 NOTE — Telephone Encounter (Signed)
No charge. 

## 2016-02-13 ENCOUNTER — Encounter: Payer: Self-pay | Admitting: Family

## 2016-02-13 ENCOUNTER — Ambulatory Visit (INDEPENDENT_AMBULATORY_CARE_PROVIDER_SITE_OTHER): Payer: BLUE CROSS/BLUE SHIELD | Admitting: Family

## 2016-02-13 VITALS — BP 112/84 | HR 76 | Temp 97.8°F | Resp 16 | Ht 65.0 in | Wt 144.2 lb

## 2016-02-13 DIAGNOSIS — L80 Vitiligo: Secondary | ICD-10-CM | POA: Diagnosis not present

## 2016-02-13 DIAGNOSIS — N926 Irregular menstruation, unspecified: Secondary | ICD-10-CM | POA: Diagnosis not present

## 2016-02-13 LAB — POCT URINE PREGNANCY: Preg Test, Ur: NEGATIVE

## 2016-02-13 MED ORDER — NORETHIN-ETH ESTRAD-FE BIPHAS 1 MG-10 MCG / 10 MCG PO TABS: 1.0000 | ORAL_TABLET | Freq: Every day | ORAL | 11 refills | Status: DC

## 2016-02-13 NOTE — Progress Notes (Signed)
Subjective:    Patient ID: Elizabeth Reese, female    DOB: 15-Jun-1975, 40 y.o.   MRN: [id number]  HPI  Elizabeth Reese is a 40 yr old female who presents today with report of + home pregnancy test. She stopped her birth control in June. Reports faintly positive home pregnancy test 2 weeks ago. Had some intermittent spotting since June.  + fatigue and breast tenderness.   She initially wanted to be pregnant but due to changes in circumstances, she no longer is hoping to be pregnant.    Skin problem- pt is concerned about hypopigmented patches on her forehead and neck.  Review of Systems See HPI  Past Medical History:  Diagnosis Date  . Baker's cyst of knee   . Depression   . Migraine      Social History   Social History  . Marital status: Single    Spouse name: N/A  . Number of children: 0  . Years of education: N/A   Occupational History  . Not on file.   Social History Main Topics  . Smoking status: Former Smoker    Types: Cigarettes  . Smokeless tobacco: Never Used     Comment: 2 cigarettes per day. hasnt smoked in 2 wks  . Alcohol use No  . Drug use: No  . Sexual activity: Yes   Other Topics Concern  . Not on file   Social History Narrative   Caffeine use:  No   Regular exercise: no   Works as a Engineer, structural.   Single, no kids   Some college   Lots of family nearby.           No past surgical history on file.  Family History  Problem Relation Age of Onset  . Hypertension Mother   . Cancer Maternal Grandfather     ? liver cancer  . Hypertension Maternal Aunt   . Diabetes Maternal Uncle   . Arthritis Maternal Grandmother   . Hypertension Maternal Grandmother   . Cancer Maternal Grandmother 52    breast    Allergies  Allergen Reactions  . Azithromycin Itching and Rash    No current outpatient prescriptions on file prior to visit.   No current facility-administered medications on file prior to visit.     BP 112/84 (BP  Location: Right Arm, Patient Position: Sitting, Cuff Size: Normal)   Pulse 76   Temp 97.8 F (36.6 C) (Oral)   Resp 16   Ht 5\' 5"  (1.651 m)   Wt 144 lb 3.2 oz (65.4 kg)   LMP  (LMP Unknown)   SpO2 100% Comment: room air  BMI 24.00 kg/m       Objective:   Physical Exam  Constitutional: She is oriented to person, place, and time. She appears well-developed and well-nourished.  Cardiovascular: Normal rate, regular rhythm and normal heart sounds.   No murmur heard. Pulmonary/Chest: Effort normal and breath sounds normal. No respiratory distress. She has no wheezes.  Musculoskeletal: She exhibits no edema.  Neurological: She is alert and oriented to person, place, and time.  Psychiatric: She has a normal mood and affect. Her behavior is normal. Judgment and thought content normal.  skin: some hypopigmented patches noted on right temple, right upper neck.         Assessment & Plan:  Irregular menses- urine hcg is negative today. Will confirm by blood. If negative by blood, she wishes to resume OCP.  Vitiligo- she is very concerned  about this- will refer to dermatology.

## 2016-02-13 NOTE — Progress Notes (Signed)
Pre visit review using our clinic review tool, if applicable. No additional management support is needed unless otherwise documented below in the visit note. 

## 2016-02-13 NOTE — Patient Instructions (Addendum)
You will be contacted about your referral to dermatology. Complete lab work prior to leaving. We will confirm negative pregnancy test by blood. If negative, you may restart your birth control.

## 2016-02-14 LAB — HCG, SERUM, QUALITATIVE: Preg, Serum: NEGATIVE

## 2016-05-08 ENCOUNTER — Ambulatory Visit (INDEPENDENT_AMBULATORY_CARE_PROVIDER_SITE_OTHER): Payer: 59 | Admitting: Medical

## 2016-05-08 ENCOUNTER — Encounter: Payer: Self-pay | Admitting: Medical

## 2016-05-08 ENCOUNTER — Ambulatory Visit (HOSPITAL_BASED_OUTPATIENT_CLINIC_OR_DEPARTMENT_OTHER)
Admission: RE | Admit: 2016-05-08 | Discharge: 2016-05-08 | Disposition: A | Discharge status: Home / Self Care | Payer: 59 | Source: Ambulatory Visit | Attending: Medical | Admitting: Medical

## 2016-05-08 VITALS — BP 101/76 | HR 94 | Temp 98.3°F | Resp 16 | Ht 65.0 in | Wt 145.5 lb

## 2016-05-08 DIAGNOSIS — J209 Acute bronchitis, unspecified: Secondary | ICD-10-CM

## 2016-05-08 DIAGNOSIS — R05 Cough: Secondary | ICD-10-CM | POA: Insufficient documentation

## 2016-05-08 DIAGNOSIS — R062 Wheezing: Secondary | ICD-10-CM

## 2016-05-08 DIAGNOSIS — J329 Chronic sinusitis, unspecified: Secondary | ICD-10-CM

## 2016-05-08 DIAGNOSIS — R0989 Other specified symptoms and signs involving the circulatory and respiratory systems: Secondary | ICD-10-CM | POA: Insufficient documentation

## 2016-05-08 DIAGNOSIS — R059 Cough, unspecified: Secondary | ICD-10-CM

## 2016-05-08 MED ORDER — FLUTICASONE PROPIONATE 50 MCG/ACT NA SUSP: 2.0000 | Freq: Every day | NASAL | 1 refills | Status: DC

## 2016-05-08 MED ORDER — ALBUTEROL SULFATE HFA 108 (90 BASE) MCG/ACT IN AERS: 2.0000 | INHALATION_SPRAY | Freq: Four times a day (QID) | RESPIRATORY_TRACT | 0 refills | Status: DC | PRN

## 2016-05-08 MED ORDER — DOXYCYCLINE HYCLATE 100 MG PO TABS: 100.0000 mg | ORAL_TABLET | Freq: Two times a day (BID) | ORAL | 0 refills | Status: DC

## 2016-05-08 MED ORDER — HYDROCODONE-HOMATROPINE 5-1.5 MG/5ML PO SYRP: 5.0000 mL | ORAL_SOLUTION | Freq: Three times a day (TID) | ORAL | 0 refills | Status: DC | PRN

## 2016-05-08 NOTE — Patient Instructions (Addendum)
You appear to have bronchitis and sinusitis. Rest hydrate and tylenol for fever. I am prescribing cough medicine hycodan, and doxycycline antibiotic. For your nasal congestion rx flonase.  Will get cxr today.(evaluate for walking pneumonia since concern of pt)  For wheezing making albuterol available to use if needed.  Follow up in 7-10 days or as needed

## 2016-05-08 NOTE — Progress Notes (Signed)
Subjective:    Patient ID: Cristy Folksamille A Kempton, female    DOB: 12/13/1975, 41 y.o.   MRN: [id number]  HPI  Pt is in with some head congestion, sinus pressure, ear pressure, st, and non productive cough(then later reported some productive cough as well). Some chills and sweats. Symptoms since Monday.  Pt states mostly sinus pressure. When blows nose gets a lot mucous.  Pt states cough is productive but minimal.   Pt had faint muscle aches first day. She states not flu like per her report.  LMP- 3.5 weeks ago. On ocp.   Some mild wheeze at night. No hx of asthma.  Review of Systems  Constitutional: Positive for chills, fatigue and fever.  HENT: Positive for congestion, sinus pain and sinus pressure. Negative for dental problem.   Respiratory: Negative for cough, chest tightness, shortness of breath and wheezing.   Cardiovascular: Negative for chest pain and palpitations.  Gastrointestinal: Negative for abdominal pain, constipation and diarrhea.  Musculoskeletal: Negative for back pain.  Skin: Negative for rash.  Neurological: Negative for dizziness and headaches.  Hematological: Negative for adenopathy. Does not bruise/bleed easily.  Psychiatric/Behavioral: Negative for behavioral problems and confusion.   Past Medical History:  Diagnosis Date  . Baker's cyst of knee   . Depression   . Migraine      Social History   Social History  . Marital status: Single    Spouse name: N/A  . Number of children: 0  . Years of education: N/A   Occupational History  . Not on file.   Social History Main Topics  . Smoking status: Former Smoker    Types: Cigarettes  . Smokeless tobacco: Never Used     Comment: 2 cigarettes per day. hasnt smoked in 2 wks  . Alcohol use No  . Drug use: No  . Sexual activity: Yes   Other Topics Concern  . 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   Single,  no kids   Some college   Lots of family nearby.           No past surgical history on file.  Family History  Problem Relation Age of Onset  . Hypertension Mother   . Cancer Maternal Grandfather     ? liver cancer  . Hypertension Maternal Aunt   . Diabetes Maternal Uncle   . Arthritis Maternal Grandmother   . Hypertension Maternal Grandmother   . Cancer Maternal Grandmother [id number]    breast    Allergies  Allergen Reactions  . Azithromycin Itching and Rash    Current Outpatient Prescriptions on File Prior to Visit  Medication Sig Dispense Refill  . Norethindrone-Ethinyl Estradiol-Fe Biphas (LO LOESTRIN FE) 1 MG-10 MCG / 10 MCG tablet Take 1 tablet by mouth daily. 28 tablet 11   No current facility-administered medications on file prior to visit.     BP 101/76 (BP Location: Left Arm, Patient Position: Sitting, Cuff Size: Large)   Pulse 94   Temp 98.3 F (36.8 C) (Oral)   Resp 16   Ht 5\' 5"  (1.651 m)   Wt 145 lb 8 oz (66 kg)   LMP 04/07/2016   SpO2 100%   BMI 24.21 kg/m       Objective:   Physical Exam  General  Mental Status - Alert. General Appearance - Well groomed. Not in acute distress.  Skin Rashes- No Rashes.  HEENT Head- Normal. Ear Auditory Canal - Left- Normal. Right - Normal.Tympanic Membrane- Left- Normal. Right- Normal. Eye Sclera/Conjunctiva- Left- Normal. Right- Normal. Nose & Sinuses Nasal Mucosa- Left-  Boggy and Congested. Right-  Boggy and  Congested.Bilateral maxillary and frontal sinus pressure. Mouth & Throat Lips: Upper Lip- Normal: no dryness, cracking, pallor, cyanosis, or vesicular eruption. Lower Lip-Normal: no dryness, cracking, pallor, cyanosis or vesicular eruption. Buccal Mucosa- Bilateral- No Aphthous ulcers. Oropharynx- No Discharge or Erythema. Tonsils: Characteristics- Bilateral- No Erythema or Congestion. Size/Enlargement- Bilateral- No enlargement. Discharge- bilateral-None.  Neck Neck- Supple. No Masses.   Chest  and Lung Exam Auscultation: Breath Sounds:- even and unlabored(mild shallow respirations)  Cardiovascular Auscultation:Rythm- Regular, rate and rhythm. Murmurs & Other Heart Sounds:Ausculatation of the heart reveal- No Murmurs.  Lymphatic Head & Neck General Head & Neck Lymphatics: Bilateral: Description- No Localized lymphadenopathy.       Assessment & Plan:  You appear to have bronchitis and sinusitis. Rest hydrate and tylenol for fever. I am prescribing cough medicine hycodan, and doxycycline antibiotic. For your nasal congestion rx flonase.  Will get cxr today. (evaluate for walking pneumonia since concern of pt)  For wheezing making albuterol available to use if needed.  Follow up in 7-10 days or as needed   Fahad Cisse, Ramon Dredge, [employer]

## 2016-05-08 NOTE — Progress Notes (Signed)
Pre visit review using our clinic review tool, if applicable. No additional management support is needed unless otherwise documented below in the visit note/SLS  

## 2016-06-10 ENCOUNTER — Encounter: Payer: Self-pay | Admitting: Family

## 2016-06-11 MED ORDER — NORGESTREL-ETHINYL ESTRADIOL 0.3-30 MG-MCG PO TABS: 1.0000 | ORAL_TABLET | Freq: Every day | ORAL | 11 refills | Status: DC

## 2016-07-03 ENCOUNTER — Encounter: Payer: Self-pay | Admitting: Family

## 2016-09-18 ENCOUNTER — Encounter: Payer: Self-pay | Admitting: Family Medicine

## 2016-09-18 ENCOUNTER — Ambulatory Visit (INDEPENDENT_AMBULATORY_CARE_PROVIDER_SITE_OTHER): Payer: 59 | Admitting: Family Medicine

## 2016-09-18 VITALS — BP 113/76 | HR 90 | Temp 98.2°F | Resp 18 | Wt 147.5 lb

## 2016-09-18 DIAGNOSIS — N912 Amenorrhea, unspecified: Secondary | ICD-10-CM | POA: Diagnosis not present

## 2016-09-18 DIAGNOSIS — W57XXXA Bitten or stung by nonvenomous insect and other nonvenomous arthropods, initial encounter: Secondary | ICD-10-CM | POA: Diagnosis not present

## 2016-09-18 LAB — POCT URINE PREGNANCY: Preg Test, Ur: NEGATIVE

## 2016-09-18 MED ORDER — DOXYCYCLINE HYCLATE 100 MG PO TABS
100.0000 mg | ORAL_TABLET | Freq: Two times a day (BID) | ORAL | 0 refills | Status: DC
Start: 2016-09-18 — End: 2017-12-03

## 2016-09-18 MED ORDER — METHYLPREDNISOLONE ACETATE 80 MG/ML IJ SUSP
80.0000 mg | Freq: Once | INTRAMUSCULAR | Status: AC
  Administered 2016-09-18: 80 mg via INTRAMUSCULAR

## 2016-09-18 NOTE — Progress Notes (Signed)
Elizabeth Reese , 1975-05-31, 41 y.o., female MRN: [id number] Patient Care Team    Relationship Specialty Notifications Start End  Elizabeth Craze, NP PCP - General Internal Medicine  07/19/11   Elizabeth Batman, MD Consulting Physician Orthopedic Surgery  06/26/15   Elizabeth Pilon, PsyD Consulting Physician Psychiatry  06/26/15     Chief Complaint  Patient presents with  . Insect Bite    left arm unknown insect type     Subjective: Pt presents for an OV with complaints of swelling and redness at site of insect bite. Pt reports she was painting outside about 1 week ago and noticed flying insects. After painting she recalls itchiness at the location on her arm that is now swollen and red since this morning. She also endorses a mild numbness surround the area. Pt endorses Moderate nausea. She reports recent unprotected sexual activity. She had discontinued her BCP pills about 3 weeks ago. She denies fever, chills, vomit, rash (other locations), dysuria, headache. She denies prodrome symptoms. Her LMP 08/16/2016.   Depression screen PHQ 2/9 02/13/2016  Decreased Interest 0  Down, Depressed, Hopeless 0  PHQ - 2 Score 0    Allergies  Allergen Reactions  . Azithromycin Itching and Rash   Social History  Substance Use Topics  . Smoking status: Former Smoker    Types: Cigarettes  . Smokeless tobacco: Never Used     Comment: 2 cigarettes per day. hasnt smoked in 2 wks  . Alcohol use No   Past Medical History:  Diagnosis Date  . Baker's cyst of knee   . Depression   . Migraine    History reviewed. No pertinent surgical history. Family History  Problem Relation Age of Onset  . Hypertension Mother   . Cancer Maternal Grandfather        ? liver cancer  . Hypertension Maternal Aunt   . Diabetes Maternal Uncle   . Arthritis Maternal Grandmother   . Hypertension Maternal Grandmother   . Cancer Maternal Grandmother 52       breast   Allergies as of 09/18/2016    Reactions   Azithromycin Itching, Rash      Medication List       Accurate as of 09/18/16 12:05 PM. Always use your most recent med list.          albuterol 108 (90 Base) MCG/ACT inhaler Commonly known as:  PROVENTIL HFA;VENTOLIN HFA Inhale 2 puffs into the lungs every 6 (six) hours as needed for wheezing or shortness of breath.   doxycycline 100 MG tablet Commonly known as:  VIBRA-TABS Take 1 tablet (100 mg total) by mouth 2 (two) times daily.   fluticasone 50 MCG/ACT nasal spray Commonly known as:  FLONASE Place 2 sprays into both nostrils daily.   norgestrel-ethinyl estradiol 0.3-30 MG-MCG tablet Commonly known as:  LO/OVRAL,CRYSELLE Take 1 tablet by mouth daily.       All past medical history, surgical history, allergies, family history, immunizations andmedications were updated in the EMR today and reviewed under the history and medication portions of their EMR.     ROS: Negative, with the exception of above mentioned in HPI   Objective:  BP 113/76 (BP Location: Right Arm, Patient Position: Sitting, Cuff Size: Normal)   Pulse 90   Temp 98.2 F (36.8 C)   Resp 18   Wt 147 lb 8 oz (66.9 kg)   SpO2 97%   BMI 24.55 kg/m  Body mass index is 24.55 kg/m.  Gen: Afebrile. No acute distress. Nontoxic in appearance, well developed, well nourished. Pleasant AAF. HENT: AT. Elizabeth Reese.  MMM, no oral lesions.  Eyes:Pupils Equal Round Reactive to light, Extraocular movements intact,  Conjunctiva without redness, discharge or icterus. Neck/lymp/endocrine: Supple,no lymphadenopathy CV: RRR  Chest: CTAB, no wheeze or crackles.  Skin: mildly raised red area left forearm 5.5 cm x 4 cm, 2 small blister like formations, 3 small area consistent with insect bite. milld paraesthesia surrounding area. No purpura or petechiae. No drainage.  Neuro: Normal gait. PERLA. EOMi. Alert. Oriented x3   No exam data present No results found. Results for orders placed or performed in visit on 09/18/16  (from the past 24 hour(s))  POCT urine pregnancy     Status: Normal   Collection Time: 09/18/16 11:56 AM  Result Value Ref Range   Preg Test, Ur Negative Negative    Assessment/Plan: Elizabeth Reese is a 41 y.o. female present for OV for  Insect bite, initial encounter - doxy BID for 7 days . - monitor for signs of worsening infection (fever, pain, swelling increased area of redness) - IM dep medrol today for local reaction/swelling, hopefully this will help sensation return to normal quicker - OTC daily antihistamine of choice encouraged.  - F/U 2 weeks if not resolved, sooner if worsening.   Amenorrhea/nausea - Menses overdue, however she has had interruption in BCP. Discussed safe sex and condom/BCP use briefly.  - nausea likely secondary to infection - POCT urine pregnancy--> negative  Reviewed expectations re: course of current medical issues.  Discussed self-management of symptoms.  Outlined signs and symptoms indicating need for more acute intervention.  Patient verbalized understanding and all questions were answered.  Patient received an After-Visit Summary.     Note is dictated utilizing voice recognition software. Although note has been proof read prior to signing, occasional typographical errors still can be missed. If any questions arise, please do not hesitate to call for verification.   electronically signed by:  Felix Pacinienee Gamal Todisco, DO  [facility] - [place]

## 2016-09-18 NOTE — Patient Instructions (Signed)
Start doxycyline today, every 12 hours for 7 days. This is an antibiotic to cover the infection.  Steroid shot today to help with the local reaction, swelling and redness.  Start over the counter antihistamine daily.  You can continue cortisone cream if desired.   Follow up in 2 weeks if not resolved or sooner if worsening.

## 2017-12-03 ENCOUNTER — Encounter: Payer: Self-pay | Admitting: Family

## 2017-12-03 ENCOUNTER — Ambulatory Visit (INDEPENDENT_AMBULATORY_CARE_PROVIDER_SITE_OTHER): Payer: 59 | Admitting: Family

## 2017-12-03 VITALS — BP 104/70 | HR 80 | Temp 98.5°F | Resp 16 | Ht 65.0 in | Wt 178.6 lb

## 2017-12-03 DIAGNOSIS — Z3A01 Less than 8 weeks gestation of pregnancy: Secondary | ICD-10-CM

## 2017-12-03 DIAGNOSIS — N912 Amenorrhea, unspecified: Secondary | ICD-10-CM | POA: Diagnosis not present

## 2017-12-03 DIAGNOSIS — Z114 Encounter for screening for human immunodeficiency virus [HIV]: Secondary | ICD-10-CM

## 2017-12-03 LAB — HCG, QUANTITATIVE, PREGNANCY: QUANTITATIVE HCG: 2215 m[IU]/mL

## 2017-12-03 LAB — POCT URINE HCG BY VISUAL COLOR COMPARISON TESTS: Preg Test, Ur: POSITIVE — AB

## 2017-12-03 LAB — HIV ANTIBODY (ROUTINE TESTING W REFLEX): HIV: NONREACTIVE

## 2017-12-03 MED ORDER — PRENATAL VITAMINS 0.8 MG PO TABS: 1.0000 | ORAL_TABLET | Freq: Every day | ORAL | Status: DC

## 2017-12-03 NOTE — Patient Instructions (Addendum)
Please add a prenatal vitamin. Complete lab work prior to leaving. Work on healthy well balanced diet and continue to avoid alcohol and undercooked foods.   Pregnancy After Age 41 Women who become pregnant after the age of 59 have a higher risk for certain problems during pregnancy. This is because older women may already have health problems before becoming pregnant. Older women who are healthy before pregnancy may still develop problems during pregnancy. These problems may affect the mother, the unborn baby (fetus), or both. What are the risks for me? If you are over age 41 and you want to become pregnant or are pregnant, you may have a higher risk of:  Not being able to get pregnant (infertility).  Going into labor early (preterm labor).  Needing surgical delivery of your baby (cesarean delivery, or C-section).  Having high blood pressure (hypertension).  Having complications during pregnancy, such as high blood pressure and other symptoms (preeclampsia).  Having diabetes during pregnancy (gestational diabetes).  Being pregnant with more than one baby.  Loss of the unborn baby before 20 weeks (miscarriage) or after 20 weeks of pregnancy (stillbirth).  What are the risks for my baby? Babies born to women over the age of 58 have a higher risk for:  Being born early (prematurity).  Low birth weight, which is less than 5 lb, 8 oz (2.5 kg).  Birth defects, such as Down syndrome and cleft palate.  Health complications, including problems with growth and development.  How is prenatal care different for women over age 28? All women should see their health care provider before they try to become pregnant. This is especially important for women over the age of 6. Tell your health care provider about:  Any health problems you have.  Any medicines you take.  Any family history of health problems or chromosome-related defects.  Any problems you have had with past pregnancies or  deliveries.  If you are over age 55 and you plan to become pregnant:  Start taking a daily multivitamin a month or more before you try to get pregnant. Your multivitamin should contain 400 mcg (micrograms) of folic acid.  If you are over age 79 and pregnant, make sure you:  Keep taking your multivitamin unless your health care provider tells you not to take it.  Keep all prenatal visits as told by your health care provider. This is important.  Have ultrasounds regularly throughout your pregnancy to check for problems.  Talk with your health care provider about other prenatal screening tests that you may need.  What additional prenatal tests are needed? Screening tests show whether your baby has a higher risk for birth defects than other babies. Screening tests include:  Ultrasound tests to look for markers that indicate a risk for birth defects.  Maternal blood screening. These are blood tests that measure certain substances in your blood to determine your baby's risk for defects.  Screening tests do not show whether your baby has or does not have defects. They only show your baby's risk for certain defects. If your screening tests show that risk factors are present, you may need tests to confirm the defect (diagnostic testing). These tests may include:  Chorionic villus sampling. For this procedure, a tissue sample is taken from the organ that forms in your uterus to nourish your baby (placenta). The sample is removed through your cervix or abdomen and tested.  Amniocentesis. For this procedure, a small amount of the fluid that surrounds the baby in the  uterus (amniotic fluid) is removed and tested.  What can I do to stay healthy during my pregnancy? Staying healthy during pregnancy can help you and your baby to have a lower risk for problems during pregnancy, during delivery, or both. Talk with your health care provider for specific instructions about staying healthy during your  pregnancy. Nutrition  At each meal, eat a variety of foods from each of the five food groups. These groups include: ? Proteins such as lean meats, poultry, fish that is low in fat, beans, eggs, and nuts. ? Vegetables such as leafy greens, raw and cooked vegetables, and vegetable juice. ? Fruits that are fresh, frozen, or canned, or 100% fruit juice. ? Dairy products such as low-fat yogurt, cheese, and milk. ? Whole grains including rice, cereal, pasta, and bread.  Talk with your health care provider about how much food in each group is right for you.  Follow instructions from your health care provider about eating and drinking restrictions during pregnancy. ? Do not eat raw eggs, raw meat, or raw fish or seafood. ? Do not eat any fish that contains high amounts of mercury, such as swordfish or mackerel.  Drink 6-8 or more glasses of water a day. You should drink enough fluid to keep your urine pale yellow. Managing weight gain  Ask your health care provider how much weight gain is healthy during pregnancy.  Stay at a healthy weight. If needed, work with your health care provider to lose weight safely. Activity  Exercise regularly, as directed by your health care provider. Ask your health care provider what forms of exercise are safe for you. General instructions  Do not use any products that contain nicotine or tobacco, such as cigarettes and e-cigarettes. If you need help quitting, ask your health care provider.  Do not drink alcohol, use drugs, or abuse prescription medicine.  Take over-the-counter and prescription medicines only as told by your health care provider.  Do not use hot tubs, steam rooms, or saunas.  Talk with your health care provider about your risk of exposure to harmful environmental conditions. This includes exposure to chemicals, radiation, cleaning products, and cat feces. Follow advice from your health care provider about how to limit your  exposure. Summary  Women who become pregnant after the age of 41 have a higher risk for complications during pregnancy.  Problems may affect the mother, the unborn baby (fetus), or both.  All women should see their health care provider before they try to become pregnant. This is especially important for women over the age of 41.  Staying healthy during pregnancy can help both you and your baby to have a lower risk for some of the problems that can happen during pregnancy, during delivery, or both. This information is not intended to replace advice given to you by your health care provider. Make sure you discuss any questions you have with your health care provider. Document Released: 08/05/2016 Document Revised: 08/05/2016 Document Reviewed: 08/05/2016 Elsevier Interactive Patient Education  2018 [employer]

## 2017-12-03 NOTE — Progress Notes (Signed)
Subjective:    Patient ID: Elizabeth Reese, female    DOB: [birth year], 42 y.o.   MRN: [id number]  HPI   Elizabeth Reese is a 42 yr old female who presents today with report of a positive home pregnancy test.  LMP 10/21/17.  Overall she reports feeling well. A bit tired. Had one episode of very scant spotting, otherwise no bleeding.  She is cautiously excited about this pregnancy. She is concerned about her advanced maternal age.     Review of Systems    see HPI  Past Medical History:  Diagnosis Date  . Baker's cyst of knee   . Depression   . Migraine      Social History   Socioeconomic History  . Marital status: Single    Spouse name: Not on file  . Number of children: 0  . Years of education: Not on file  . Highest education level: Not on file  Occupational History  . Not on file  Social Needs  . Financial resource strain: Not on file  . Food insecurity:    Worry: Not on file    Inability: Not on file  . Transportation needs:    Medical: Not on file    Non-medical: Not on file  Tobacco Use  . Smoking status: Former Smoker    Types: Cigarettes  . Smokeless tobacco: Never Used  . Tobacco comment: 2 cigarettes per day. hasnt smoked in 2 wks  Substance and Sexual Activity  . Alcohol use: No    Alcohol/week: 6.0 oz    Types: 10 Standard drinks or equivalent per week  . Drug use: No  . Sexual activity: Yes  Lifestyle  . Physical activity:    Days per week: Not on file    Minutes per session: Not on file  . Stress: Not on file  Relationships  . Social connections:    Talks on phone: Not on file    Gets together: Not on file    Attends religious service: Not on file    Active member of club or organization: Not on file    Attends meetings of clubs or organizations: Not on file    Relationship status: Not on file  . Intimate partner violence:    Fear of current or ex partner: Not on file    Emotionally abused: Not on file    Physically abused: Not on file   Forced sexual activity: Not on file  Other Topics Concern  . 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   Single, no kids   Some college   Lots of family nearby.        No past surgical history on file.  Family History  Problem Relation Age of Onset  . Hypertension Mother   . Cancer Maternal Grandfather        ? liver cancer  . Hypertension Maternal Aunt   . Diabetes Maternal Uncle   . Arthritis Maternal Grandmother   . Hypertension Maternal Grandmother   . Cancer Maternal Grandmother [phone]       breast    Allergies  Allergen Reactions  . Azithromycin Itching and Rash    Current Outpatient Medications on File Prior to Visit  Medication Sig Dispense Refill  . albuterol (PROVENTIL HFA;VENTOLIN HFA) 108 (90 Base) MCG/ACT inhaler Inhale 2 puffs into the lungs every 6 (six) hours  as needed for wheezing or shortness of breath. (Patient not taking: Reported on 12/03/2017) 1 Inhaler 0  . doxycycline (VIBRA-TABS) 100 MG tablet Take 1 tablet (100 mg total) by mouth 2 (two) times daily. (Patient not taking: Reported on 12/03/2017) 14 tablet 0  . fluticasone (FLONASE) 50 MCG/ACT nasal spray Place 2 sprays into both nostrils daily. (Patient not taking: Reported on 09/18/2016) 16 g 1  . norgestrel-ethinyl estradiol (LO/OVRAL,CRYSELLE) 0.3-30 MG-MCG tablet Take 1 tablet by mouth daily. (Patient not taking: Reported on 09/18/2016) 1 Package 11   No current facility-administered medications on file prior to visit.     BP 104/70 (BP Location: Right Arm, Cuff Size: Normal)   Pulse 80   Temp 98.5 F (36.9 C) (Oral)   Resp 16   Ht 5\' 5"  (1.651 m)   Wt 178 lb 9.6 oz (81 kg)   LMP 10/21/2017   SpO2 100%   BMI 29.72 kg/m    Objective:   Physical Exam  Constitutional: She appears well-developed and well-nourished.  Cardiovascular: Normal rate, regular rhythm and normal heart sounds.  No murmur  heard. Pulmonary/Chest: Effort normal and breath sounds normal. No respiratory distress. She has no wheezes.  Psychiatric: She has a normal mood and affect. Her behavior is normal. Judgment and thought content normal.          Assessment & Plan:  Pregnancy- POC urine hcg also positive today. Advised pt to begin a prenatal vitamin. Discussed importance of nutrition and exercise during her pregnancy as well as alcohol avoidance. Will send Hcg quant test as well as HIV screening.    A total of 20  minutes were spent face-to-face with the patient during this encounter and over half of that time was spent on counseling and coordination of care. The patient was counseled on pregnancy,importance of  prenatal care, nutrition/exercise.

## 2017-12-04 ENCOUNTER — Encounter: Payer: Self-pay | Admitting: Family

## 2017-12-08 ENCOUNTER — Encounter: Payer: Self-pay | Admitting: Family

## 2017-12-23 ENCOUNTER — Ambulatory Visit (INDEPENDENT_AMBULATORY_CARE_PROVIDER_SITE_OTHER): Payer: 59 | Admitting: Advanced Practice Midwife

## 2017-12-23 ENCOUNTER — Encounter: Payer: Self-pay | Admitting: Advanced Practice Midwife

## 2017-12-23 DIAGNOSIS — Z124 Encounter for screening for malignant neoplasm of cervix: Secondary | ICD-10-CM | POA: Diagnosis not present

## 2017-12-23 DIAGNOSIS — O09521 Supervision of elderly multigravida, first trimester: Secondary | ICD-10-CM

## 2017-12-23 DIAGNOSIS — Z3481 Encounter for supervision of other normal pregnancy, first trimester: Secondary | ICD-10-CM

## 2017-12-23 DIAGNOSIS — Z1151 Encounter for screening for human papillomavirus (HPV): Secondary | ICD-10-CM | POA: Diagnosis not present

## 2017-12-23 DIAGNOSIS — Z3480 Encounter for supervision of other normal pregnancy, unspecified trimester: Secondary | ICD-10-CM | POA: Diagnosis not present

## 2017-12-23 DIAGNOSIS — Z113 Encounter for screening for infections with a predominantly sexual mode of transmission: Secondary | ICD-10-CM | POA: Diagnosis not present

## 2017-12-23 NOTE — Patient Instructions (Signed)
First Trimester of Pregnancy The first trimester of pregnancy is from week 1 until the end of week 13 (months 1 through 3). During this time, your baby will begin to develop inside you. At 6-8 weeks, the eyes and face are formed, and the heartbeat can be seen on ultrasound. At the end of 12 weeks, all the baby's organs are formed. Prenatal care is all the medical care you receive before the birth of your baby. Make sure you get good prenatal care and follow all of your doctor's instructions. Follow these instructions at home: Medicines  Take over-the-counter and prescription medicines only as told by your doctor. Some medicines are safe and some medicines are not safe during pregnancy.  Take a prenatal vitamin that contains at least 600 micrograms (mcg) of folic acid.  If you have trouble pooping (constipation), take medicine that will make your stool soft (stool softener) if your doctor approves. Eating and drinking  Eat regular, healthy meals.  Your doctor will tell you the amount of weight gain that is right for you.  Avoid raw meat and uncooked cheese.  If you feel sick to your stomach (nauseous) or throw up (vomit): ? Eat 4 or 5 small meals a day instead of 3 large meals. ? Try eating a few soda crackers. ? Drink liquids between meals instead of during meals.  To prevent constipation: ? Eat foods that are high in fiber, like fresh fruits and vegetables, whole grains, and beans. ? Drink enough fluids to keep your pee (urine) clear or pale yellow. Activity  Exercise only as told by your doctor. Stop exercising if you have cramps or pain in your lower belly (abdomen) or low back.  Do not exercise if it is too hot, too humid, or if you are in a place of great height (high altitude).  Try to avoid standing for long periods of time. Move your legs often if you must stand in one place for a long time.  Avoid heavy lifting.  Wear low-heeled shoes. Sit and stand up straight.  You  can have sex unless your doctor tells you not to. Relieving pain and discomfort  Wear a good support bra if your breasts are sore.  Take warm water baths (sitz baths) to soothe pain or discomfort caused by hemorrhoids. Use hemorrhoid cream if your doctor says it is okay.  Rest with your legs raised if you have leg cramps or low back pain.  If you have puffy, bulging veins (varicose veins) in your legs: ? Wear support hose or compression stockings as told by your doctor. ? Raise (elevate) your feet for 15 minutes, 3-4 times a day. ? Limit salt in your food. Prenatal care  Schedule your prenatal visits by the twelfth week of pregnancy.  Write down your questions. Take them to your prenatal visits.  Keep all your prenatal visits as told by your doctor. This is important. Safety  Wear your seat belt at all times when driving.  Make a list of emergency phone numbers. The list should include numbers for family, friends, the hospital, and police and fire departments. General instructions  Ask your doctor for a referral to a local prenatal class. Begin classes no later than at the start of month 6 of your pregnancy.  Ask for help if you need counseling or if you need help with nutrition. Your doctor can give you advice or tell you where to go for help.  Do not use hot tubs, steam rooms, or   saunas.  Do not douche or use tampons or scented sanitary pads.  Do not cross your legs for long periods of time.  Avoid all herbs and alcohol. Avoid drugs that are not approved by your doctor.  Do not use any tobacco products, including cigarettes, chewing tobacco, and electronic cigarettes. If you need help quitting, ask your doctor. You may get counseling or other support to help you quit.  Avoid cat litter boxes and soil used by cats. These carry germs that can cause birth defects in the baby and can cause a loss of your baby (miscarriage) or stillbirth.  Visit your dentist. At home, brush  your teeth with a soft toothbrush. Be gentle when you floss. Contact a doctor if:  You are dizzy.  You have mild cramps or pressure in your lower belly.  You have a nagging pain in your belly area.  You continue to feel sick to your stomach, you throw up, or you have watery poop (diarrhea).  You have a bad smelling fluid coming from your vagina.  You have pain when you pee (urinate).  You have increased puffiness (swelling) in your face, hands, legs, or ankles. Get help right away if:  You have a fever.  You are leaking fluid from your vagina.  You have spotting or bleeding from your vagina.  You have very bad belly cramping or pain.  You gain or lose weight rapidly.  You throw up blood. It may look like coffee grounds.  You are around people who have German measles, fifth disease, or chickenpox.  You have a very bad headache.  You have shortness of breath.  You have any kind of trauma, such as from a fall or a car accident. Summary  The first trimester of pregnancy is from week 1 until the end of week 13 (months 1 through 3).  To take care of yourself and your unborn baby, you will need to eat healthy meals, take medicines only if your doctor tells you to do so, and do activities that are safe for you and your baby.  Keep all follow-up visits as told by your doctor. This is important as your doctor will have to ensure that your baby is healthy and growing well. This information is not intended to replace advice given to you by your health care provider. Make sure you discuss any questions you have with your health care provider. Document Released: 10/02/2007 Document Revised: 04/23/2016 Document Reviewed: 04/23/2016 Elsevier Interactive Patient Education  2017 [employer]  

## 2017-12-23 NOTE — Progress Notes (Signed)
Subjective:    Elizabeth Reese is a [id number] [redacted]w[redacted]d being seen today for her first obstetrical visit.  Her obstetrical history is significant for advanced maternal age. Patient does intend to breast feed. Pregnancy history fully reviewed.  Patient reports no complaints.  Last pregnancy ended in Missed AB in 2013.  Needed D&C. Can't remember who did it  Vitals:   12/23/17 0955  BP: 97/61  Pulse: 73  Weight: 81.2 kg    HISTORY: OB History  Gravida Para Term Preterm AB Living  3       2    SAB TAB Ectopic Multiple Live Births  1 1          # Outcome Date GA Lbr Len/2nd Weight Sex Delivery Anes PTL Lv  3 Current           2 SAB 2012          1 TAB 2006           Past Medical History:  Diagnosis Date  . Baker's cyst of knee   . Depression   . Migraine    History reviewed. No pertinent surgical history. Family History  Problem Relation Age of Onset  . Hypertension Mother   . Cancer Maternal Grandfather        ? liver cancer  . Hypertension Maternal Aunt   . Diabetes Maternal Uncle   . Arthritis Maternal Grandmother   . Hypertension Maternal Grandmother   . Cancer Maternal Grandmother 86       breast     Exam    Uterus:     Pelvic Exam:    Perineum: No Hemorrhoids, Normal Perineum   Vulva: Bartholin's, Urethra, Skene's normal   Vagina:  normal mucosa, normal discharge   pH:    Cervix: no bleeding following Pap and no cervical motion tenderness   Adnexa: no mass, fullness, tenderness   Bony Pelvis: gynecoid  System: Breast:  normal appearance, no masses or tenderness   Skin: normal coloration and turgor, no rashes    Neurologic: oriented, grossly non-focal   Extremities: normal strength, tone, and muscle mass   HEENT neck supple with midline trachea   Mouth/Teeth mucous membranes moist, pharynx normal without lesions   Neck supple   Cardiovascular: regular rate and rhythm   Respiratory:  appears well, vitals normal, no respiratory distress, acyanotic,  normal RR, ear and throat exam is normal, neck free of mass or lymphadenopathy, chest clear, no wheezing, crepitations, rhonchi, normal symmetric air entry   Abdomen: soft, non-tender; bowel sounds normal; no masses,  no organomegaly   Urinary: urethral meatus normal      Assessment:    Pregnancy: [id number] Patient Active Problem List   Diagnosis Date Noted  . Supervision of other normal pregnancy, antepartum 12/23/2017  . AMA (advanced maternal age) multigravida 35+, first trimester 12/23/2017  . Baker's cyst 06/08/2015  . Insomnia 06/08/2014  . Family history of breast cancer 02/24/2013  . Preventative health care 10/04/2012  . Depression 09/28/2012        Plan:     Initial labs drawn. Prenatal vitamins. Problem list reviewed and updated. Genetic Screening discussed Cell free DNA: requested.  Ultrasound discussed; fetal survey: requested.  Follow up in 4 weeks. 50% of 30 min visit spent on counseling and coordination of care.   Declines optimized Babyscripts schedule Nervous! But excited! Routines reviewed, including use of students and residents Disappointed FOB was not in room for [place]. They are both excited to  see pictures              (perhaps we can show FOB brief US next visit?) Not opposed to students/residents Wants to do Panorama Advised on use of [facility] and move to [facility] in Feb    Elizabeth Reese 12/23/2017

## 2017-12-23 NOTE — Progress Notes (Signed)
DATING AND VIABILITY SONOGRAM   Elizabeth Reese is a 42 y.o. year old G3P0020 with LMP Patient's last menstrual period was 10/21/2017 (exact date). which would correlate to  221w0d weeks gestation.  She has regular menstrual cycles.   She is here today for a confirmatory initial sonogram.    GESTATION: SINGLETON     FETAL ACTIVITY:          Heart rate       148          The fetus is active.   ADNEXA: The ovaries are normal.   GESTATIONAL AGE AND  BIOMETRICS:  Gestational criteria: Estimated Date of Delivery: 07/28/18 by LMP now at 7-2  Previous Scans:0  GESTATIONAL SAC           2.35cm       7-3 weeks  CROWN RUMP LENGTH           1.19 cm        7-2 weeks                                                                               AVERAGE EGA(BY THIS SCAN):  7-2weeks  WORKING EDD( early ultrasound ):   08/08/17   Armandina StammerJennifer Howard 12/23/2017 10:25 AM  The patient was seen and examined by me also Agree with note EDC changed to correspond with ultrasound Aviva SignsWilliams, Marie L, CNM

## 2017-12-25 LAB — URINE CULTURE, OB REFLEX

## 2017-12-25 LAB — CULTURE, OB URINE

## 2017-12-26 ENCOUNTER — Other Ambulatory Visit: Payer: Self-pay | Admitting: Advanced Practice Midwife

## 2017-12-26 ENCOUNTER — Telehealth: Payer: Self-pay

## 2017-12-26 LAB — CYTOLOGY - PAP
ADEQUACY: ABSENT
CHLAMYDIA, DNA PROBE: NEGATIVE
Diagnosis: NEGATIVE
HPV: NOT DETECTED
NEISSERIA GONORRHEA: NEGATIVE

## 2017-12-26 MED ORDER — NITROFURANTOIN MONOHYD MACRO 100 MG PO CAPS: 100.0000 mg | ORAL_CAPSULE | Freq: Two times a day (BID) | ORAL | 1 refills | Status: DC

## 2017-12-26 NOTE — Telephone Encounter (Signed)
-----   Message from Aviva SignsMarie L Williams, CNM sent at 12/26/2017  9:32 AM EDT ----- Regarding: UTI Has UTI I put in order for Macrobid Can youcall her?  Hilda LiasMarie

## 2017-12-26 NOTE — Progress Notes (Signed)
E Coli UTI Rx Macrobid

## 2017-12-26 NOTE — Telephone Encounter (Signed)
Left message for patient to return call to office. Ranya Fiddler  RN 

## 2018-01-03 LAB — HEMOGLOBINOPATHY EVALUATION
Ferritin: 253 ng/mL — ABNORMAL HIGH (ref 15–150)
HGB A: 97.4 % (ref 96.4–98.8)
HGB VARIANT: 0 %
Hgb A2 Quant: 2.6 % (ref 1.8–3.2)
Hgb C: 0 %
Hgb F Quant: 0 % (ref 0.0–2.0)
Hgb S: 0 %
Hgb Solubility: NEGATIVE

## 2018-01-03 LAB — OBSTETRIC PANEL, INCLUDING HIV
Antibody Screen: NEGATIVE
Basophils Absolute: 0 10*3/uL (ref 0.0–0.2)
Basos: 0 %
EOS (ABSOLUTE): 0.5 10*3/uL — ABNORMAL HIGH (ref 0.0–0.4)
EOS: 8 %
HEMATOCRIT: 37.8 % (ref 34.0–46.6)
HEMOGLOBIN: 12.7 g/dL (ref 11.1–15.9)
HIV Screen 4th Generation wRfx: NONREACTIVE
Hepatitis B Surface Ag: NEGATIVE
IMMATURE GRANS (ABS): 0 10*3/uL (ref 0.0–0.1)
IMMATURE GRANULOCYTES: 1 %
LYMPHS: 25 %
Lymphocytes Absolute: 1.4 10*3/uL (ref 0.7–3.1)
MCH: 32.6 pg (ref 26.6–33.0)
MCHC: 33.6 g/dL (ref 31.5–35.7)
MCV: 97 fL (ref 79–97)
Monocytes Absolute: 0.6 10*3/uL (ref 0.1–0.9)
Monocytes: 11 %
NEUTROS PCT: 55 %
Neutrophils Absolute: 3.1 10*3/uL (ref 1.4–7.0)
Platelets: 326 10*3/uL (ref 150–450)
RBC: 3.9 x10E6/uL (ref 3.77–5.28)
RDW: 13.8 % (ref 12.3–15.4)
RPR Ser Ql: NONREACTIVE
RUBELLA: 2.42 {index} (ref >0.99)
Rh Factor: POSITIVE
WBC: 5.6 10*3/uL (ref 3.4–10.8)

## 2018-01-03 LAB — SMN1 COPY NUMBER ANALYSIS (SMA CARRIER SCREENING)

## 2018-01-03 LAB — CYSTIC FIBROSIS GENE TEST

## 2018-01-08 ENCOUNTER — Encounter: Payer: Self-pay | Admitting: Advanced Practice Midwife

## 2018-01-08 DIAGNOSIS — R7989 Other specified abnormal findings of blood chemistry: Secondary | ICD-10-CM | POA: Insufficient documentation

## 2018-01-09 ENCOUNTER — Telehealth: Payer: Self-pay

## 2018-01-09 NOTE — Telephone Encounter (Signed)
Pt called the office stating that she has been having brown discharge x 1 week. Pt denies itching and odor. Pt also states that she has been SOB while at work or when she walks up stairs.

## 2018-01-09 NOTE — Telephone Encounter (Signed)
Called patient - no chest pain, chest pressure, unilateral leg pain/swelling/erythema. Has low BP - possible cause of SOB. Will have her increase water intake. Discussed warning signs to be seen emergently.  Also discussed discharge - likely normal. F/u at next appt.

## 2018-01-09 NOTE — Telephone Encounter (Signed)
-----   Message from Elizabeth Reese, [place] sent at 01/09/2018  8:06 AM EDT ----- This pt called the office yesterday c/o shortness of breath, she says she feels SOB when she walks up stairs or while she walking around at work.

## 2018-01-20 ENCOUNTER — Ambulatory Visit (INDEPENDENT_AMBULATORY_CARE_PROVIDER_SITE_OTHER): Payer: 59 | Admitting: Advanced Practice Midwife

## 2018-01-20 VITALS — BP 106/68 | HR 90 | Wt 179.1 lb

## 2018-01-20 DIAGNOSIS — Z3481 Encounter for supervision of other normal pregnancy, first trimester: Secondary | ICD-10-CM

## 2018-01-20 DIAGNOSIS — O09529 Supervision of elderly multigravida, unspecified trimester: Secondary | ICD-10-CM

## 2018-01-20 DIAGNOSIS — O09521 Supervision of elderly multigravida, first trimester: Secondary | ICD-10-CM

## 2018-01-20 DIAGNOSIS — Z3480 Encounter for supervision of other normal pregnancy, unspecified trimester: Secondary | ICD-10-CM

## 2018-01-20 DIAGNOSIS — N9089 Other specified noninflammatory disorders of vulva and perineum: Secondary | ICD-10-CM

## 2018-01-20 NOTE — Patient Instructions (Signed)
Second Trimester of Pregnancy The second trimester is from week 13 through week 28, month 4 through 6. This is often the time in pregnancy that you feel your best. Often times, morning sickness has lessened or quit. You may have more energy, and you may get hungry more often. Your unborn baby (fetus) is growing rapidly. At the end of the sixth month, he or she is about 9 inches long and weighs about 1 pounds. You will likely feel the baby move (quickening) between 18 and 20 weeks of pregnancy. Follow these instructions at home:  Avoid all smoking, herbs, and alcohol. Avoid drugs not approved by your doctor.  Do not use any tobacco products, including cigarettes, chewing tobacco, and electronic cigarettes. If you need help quitting, ask your doctor. You may get counseling or other support to help you quit.  Only take medicine as told by your doctor. Some medicines are safe and some are not during pregnancy.  Exercise only as told by your doctor. Stop exercising if you start having cramps.  Eat regular, healthy meals.  Wear a good support bra if your breasts are tender.  Do not use hot tubs, steam rooms, or saunas.  Wear your seat belt when driving.  Avoid raw meat, uncooked cheese, and liter boxes and soil used by cats.  Take your prenatal vitamins.  Take 1500-2000 milligrams of calcium daily starting at the 20th week of pregnancy until you deliver your baby.  Try taking medicine that helps you poop (stool softener) as needed, and if your doctor approves. Eat more fiber by eating fresh fruit, vegetables, and whole grains. Drink enough fluids to keep your pee (urine) clear or pale yellow.  Take warm water baths (sitz baths) to soothe pain or discomfort caused by hemorrhoids. Use hemorrhoid cream if your doctor approves.  If you have puffy, bulging veins (varicose veins), wear support hose. Raise (elevate) your feet for 15 minutes, 3-4 times a day. Limit salt in your diet.  Avoid heavy  lifting, wear low heals, and sit up straight.  Rest with your legs raised if you have leg cramps or low back pain.  Visit your dentist if you have not gone during your pregnancy. Use a soft toothbrush to brush your teeth. Be gentle when you floss.  You can have sex (intercourse) unless your doctor tells you not to.  Go to your doctor visits. Get help if:  You feel dizzy.  You have mild cramps or pressure in your lower belly (abdomen).  You have a nagging pain in your belly area.  You continue to feel sick to your stomach (nauseous), throw up (vomit), or have watery poop (diarrhea).  You have bad smelling fluid coming from your vagina.  You have pain with peeing (urination). Get help right away if:  You have a fever.  You are leaking fluid from your vagina.  You have spotting or bleeding from your vagina.  You have severe belly cramping or pain.  You lose or gain weight rapidly.  You have trouble catching your breath and have chest pain.  You notice sudden or extreme puffiness (swelling) of your face, hands, ankles, feet, or legs.  You have not felt the baby move in over an hour.  You have severe headaches that do not go away with medicine.  You have vision changes. This information is not intended to replace advice given to you by your health care provider. Make sure you discuss any questions you have with your health care   provider. Document Released: 07/10/2009 Document Revised: 09/21/2015 Document Reviewed: 06/16/2012 Elsevier Interactive Patient Education  2017 Elsevier Inc.  

## 2018-01-20 NOTE — Progress Notes (Signed)
   PRENATAL VISIT NOTE  Subjective:  Elizabeth Reese is a 42 y.o. G3P0020 at 6271w2d being seen today for ongoing prenatal care.  She is currently monitored for the following issues for this low-risk pregnancy and has Depression; Preventative health care; Family history of breast cancer; Insomnia; Baker's cyst; Supervision of other normal pregnancy, antepartum; AMA (advanced maternal age) multigravida 35+, first trimester; and Elevated ferritin on their problem list.  Patient reports intermittent chest pressure this week, none now.  Noticed a white area on vulva this week which she has never noticed before.  Does not itch or hurt.  Contractions: Not present. Vag. Bleeding: None.  Movement: Absent. Denies leaking of fluid.   The following portions of the patient's history were reviewed and updated as appropriate: allergies, current medications, past family history, past medical history, past social history, past surgical history and problem list. Problem list updated.  Objective:   Vitals:   01/20/18 0934  BP: 106/68  Pulse: 90  Weight: 81.2 kg    Fetal Status: Fetal Heart Rate (bpm): 159   Movement: Absent     General:  Alert, oriented and cooperative. Patient is in no acute distress.  Skin: Skin is warm and dry. No rash noted.   Cardiovascular: Normal heart rate noted  Respiratory: Normal respiratory effort, no problems with respiration noted  Abdomen: Soft, gravid, appropriate for gestational age.  Pain/Pressure: Present     Pelvic: Cervical exam deferred        External exam showed a depigmented lesion on right vulva, irregular border.  See below.  No scaling or erethema.  Extremities: Normal range of motion.  Edema: None  Mental Status: Normal mood and affect. Normal behavior. Normal judgment and thought content.     Assessment and Plan:  Pregnancy: G3P0020 at 3771w2d  1. Supervision of other normal pregnancy, antepartum     Panorama done today - Genetic Screening  2.    Depigmented lesion on vulva        Will consult MD   Preterm labor symptoms and general obstetric precautions including but not limited to vaginal bleeding, contractions, leaking of fluid and fetal movement were reviewed in detail with the patient. Please refer to After Visit Summary for other counseling recommendations.  [facility] about 4 [facility] (around 02[facility]17/2018[facility]   Wynelle BourgeoisMarie Boruch Manuele, CNM

## 2018-01-22 ENCOUNTER — Encounter: Payer: Self-pay | Admitting: Advanced Practice Midwife

## 2018-01-26 ENCOUNTER — Telehealth: Payer: Self-pay

## 2018-01-26 NOTE — Telephone Encounter (Signed)
Called pt to give Panorama results. Left message for pt to call the office back.

## 2018-01-27 NOTE — Telephone Encounter (Signed)
Pt called the office for Panorama results. Explained to to pt that the panorama results are normal. Pt states that she does not want to know the sex of the baby. An envelope with the panorama results will be picked up by the patient.

## 2018-02-09 ENCOUNTER — Encounter: Payer: Self-pay | Admitting: Obstetrics & Gynecology

## 2018-02-19 ENCOUNTER — Ambulatory Visit (INDEPENDENT_AMBULATORY_CARE_PROVIDER_SITE_OTHER): Payer: 59 | Admitting: Family Medicine

## 2018-02-19 VITALS — BP 106/72 | HR 108 | Wt 181.0 lb

## 2018-02-19 DIAGNOSIS — G43009 Migraine without aura, not intractable, without status migrainosus: Secondary | ICD-10-CM

## 2018-02-19 DIAGNOSIS — Z23 Encounter for immunization: Secondary | ICD-10-CM

## 2018-02-19 DIAGNOSIS — O09521 Supervision of elderly multigravida, first trimester: Secondary | ICD-10-CM

## 2018-02-19 DIAGNOSIS — Z3480 Encounter for supervision of other normal pregnancy, unspecified trimester: Secondary | ICD-10-CM

## 2018-02-19 DIAGNOSIS — O09522 Supervision of elderly multigravida, second trimester: Secondary | ICD-10-CM

## 2018-02-19 DIAGNOSIS — R7989 Other specified abnormal findings of blood chemistry: Secondary | ICD-10-CM

## 2018-02-19 DIAGNOSIS — Z3482 Encounter for supervision of other normal pregnancy, second trimester: Secondary | ICD-10-CM

## 2018-02-19 MED ORDER — ONDANSETRON 4 MG PO TBDP: 4.0000 mg | ORAL_TABLET | Freq: Four times a day (QID) | ORAL | 0 refills | Status: DC | PRN

## 2018-02-19 MED ORDER — ASPIRIN EC 81 MG PO TBEC: 81.0000 mg | DELAYED_RELEASE_TABLET | Freq: Every day | ORAL | 2 refills | Status: DC

## 2018-02-19 NOTE — Progress Notes (Signed)
   PRENATAL VISIT NOTE  Subjective:  Elizabeth Reese is a 42 y.o. G3P0020 at [redacted]w[redacted]d being seen today for ongoing prenatal care.  She is currently monitored for the following issues for this high-risk pregnancy and has Depression; Preventative health care; Family history of breast cancer; Insomnia; Baker's cyst; Supervision of other normal pregnancy, antepartum; AMA (advanced maternal age) multigravida 35+, first trimester; and Elevated ferritin on their problem list.  Patient reports migraines.  Contractions: Not present. Vag. Bleeding: Small.  Movement: Present. Denies leaking of fluid.   The following portions of the patient's history were reviewed and updated as appropriate: allergies, current medications, past family history, past medical history, past social history, past surgical history and problem list. Problem list updated.  Objective:   Vitals:   02/19/18 0912  BP: 106/72  Pulse: (!) 108  Weight: 181 lb (82.1 kg)    Fetal Status:     Movement: Present     General:  Alert, oriented and cooperative. Patient is in no acute distress.  Skin: Skin is warm and dry. No rash noted.   Cardiovascular: Normal heart rate noted  Respiratory: Normal respiratory effort, no problems with respiration noted  Abdomen: Soft, gravid, appropriate for gestational age.  Pain/Pressure: Absent     Pelvic: Cervical exam deferred        Extremities: Normal range of motion.  Edema: None  Mental Status: Normal mood and affect. Normal behavior. Normal judgment and thought content.   Assessment and Plan:  Pregnancy: G3P0020 at [redacted]w[redacted]d  1. Supervision of other normal pregnancy, antepartum FHT and FH normal  2. AMA (advanced maternal age) multigravida 35+, first trimester Start ASA 81mg   3. Elevated ferritin  4. 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 Please refer to After Visit Summary for other counseling recommendations.  Return in about 4 weeks (around 03/19/2018).  Future Appointments  Date Time Provider Department Center  03/09/2018 10:00 AM [facility] [place] 3 [facility]-US    Levie Heritage, DO

## 2018-03-02 ENCOUNTER — Encounter (HOSPITAL_COMMUNITY): Payer: Self-pay

## 2018-03-06 ENCOUNTER — Other Ambulatory Visit: Payer: Self-pay | Admitting: Family

## 2018-03-09 ENCOUNTER — Encounter (HOSPITAL_COMMUNITY): Payer: Self-pay

## 2018-03-09 ENCOUNTER — Ambulatory Visit (HOSPITAL_COMMUNITY)
Admission: RE | Admit: 2018-03-09 | Discharge: 2018-03-09 | Disposition: A | Discharge status: Home / Self Care | Payer: 59 | Source: Ambulatory Visit | Attending: Advanced Practice Midwife | Admitting: Advanced Practice Midwife

## 2018-03-09 ENCOUNTER — Other Ambulatory Visit (HOSPITAL_COMMUNITY): Payer: Self-pay | Admitting: *Deleted

## 2018-03-09 DIAGNOSIS — Z363 Encounter for antenatal screening for malformations: Secondary | ICD-10-CM | POA: Diagnosis not present

## 2018-03-09 DIAGNOSIS — O09522 Supervision of elderly multigravida, second trimester: Secondary | ICD-10-CM

## 2018-03-09 DIAGNOSIS — Z3A18 18 weeks gestation of pregnancy: Secondary | ICD-10-CM | POA: Diagnosis not present

## 2018-03-09 DIAGNOSIS — Z362 Encounter for other antenatal screening follow-up: Secondary | ICD-10-CM

## 2018-03-09 DIAGNOSIS — O09529 Supervision of elderly multigravida, unspecified trimester: Secondary | ICD-10-CM

## 2018-03-09 MED ORDER — PRENATAL VITAMINS 0.8 MG PO TABS: 1.0000 | ORAL_TABLET | Freq: Every day | ORAL | 1 refills | Status: DC

## 2018-03-11 ENCOUNTER — Other Ambulatory Visit (HOSPITAL_COMMUNITY): Payer: Self-pay | Admitting: *Deleted

## 2018-03-11 DIAGNOSIS — O09522 Supervision of elderly multigravida, second trimester: Secondary | ICD-10-CM

## 2018-03-19 ENCOUNTER — Encounter (HOSPITAL_COMMUNITY): Payer: Self-pay

## 2018-03-19 ENCOUNTER — Ambulatory Visit (INDEPENDENT_AMBULATORY_CARE_PROVIDER_SITE_OTHER): Payer: 59 | Admitting: Family Medicine

## 2018-03-19 ENCOUNTER — Ambulatory Visit (HOSPITAL_COMMUNITY): Payer: 59

## 2018-03-19 VITALS — BP 95/63 | HR 102 | Wt 181.1 lb

## 2018-03-19 DIAGNOSIS — O09522 Supervision of elderly multigravida, second trimester: Secondary | ICD-10-CM

## 2018-03-19 DIAGNOSIS — Z3480 Encounter for supervision of other normal pregnancy, unspecified trimester: Secondary | ICD-10-CM | POA: Diagnosis not present

## 2018-03-19 DIAGNOSIS — G43009 Migraine without aura, not intractable, without status migrainosus: Secondary | ICD-10-CM

## 2018-03-19 DIAGNOSIS — Z3482 Encounter for supervision of other normal pregnancy, second trimester: Secondary | ICD-10-CM

## 2018-03-19 DIAGNOSIS — O09521 Supervision of elderly multigravida, first trimester: Secondary | ICD-10-CM

## 2018-03-19 DIAGNOSIS — R7989 Other specified abnormal findings of blood chemistry: Secondary | ICD-10-CM

## 2018-03-19 DIAGNOSIS — O09529 Supervision of elderly multigravida, unspecified trimester: Secondary | ICD-10-CM

## 2018-03-19 NOTE — Progress Notes (Signed)
   PRENATAL VISIT NOTE  Subjective:  Elizabeth Reese is a 42 y.o. G3P0020 at 2778w4d being seen today for ongoing prenatal care.  She is currently monitored for the following issues for this high-risk pregnancy and has Depression; Preventative health care; Family history of breast cancer; Insomnia; Baker's cyst; Supervision of other normal pregnancy, antepartum; AMA (advanced maternal age) multigravida 35+, first trimester; and Elevated ferritin on their problem list.  Patient reports no complaints.  Contractions: Not present. Vag. Bleeding: None.  Movement: Present. Denies leaking of fluid.   The following portions of the patient's history were reviewed and updated as appropriate: allergies, current medications, past family history, past medical history, past social history, past surgical history and problem list. Problem list updated.  Objective:   Vitals:   03/19/18 0928  BP: 95/63  Pulse: (!) 102  Weight: 181 lb 1.3 oz (82.1 kg)    Fetal Status: Fetal Heart Rate (bpm): 136   Movement: Present     General:  Alert, oriented and cooperative. Patient is in no acute distress.  Skin: Skin is warm and dry. No rash noted.   Cardiovascular: Normal heart rate noted  Respiratory: Normal respiratory effort, no problems with respiration noted  Abdomen: Soft, gravid, appropriate for gestational age.  Pain/Pressure: Absent     Pelvic: Cervical exam deferred        Extremities: Normal range of motion.  Edema: None  Mental Status: Normal mood and affect. Normal behavior. Normal judgment and thought content.   Assessment and Plan:  Pregnancy: G3P0020 at 878w4d  1. Supervision of other normal pregnancy, antepartum FHT and FH normal - AFP TETRA  2. Elevated ferritin Recheck ferritin - Ferritin - AFP TETRA  3. AMA (advanced maternal age) multigravida 35+, first trimester Patient is electing to get Amniocentesis.  Growth US in 4 weeks. - AFP TETRA  4. Migraine without aura and without  status migrainosus, not intractable   Preterm labor symptoms and general obstetric precautions including but not limited to vaginal bleeding, contractions, leaking of fluid and fetal movement were reviewed in detail with the patient. Please refer to After Visit Summary for other counseling recommendations.  Return in about 4 weeks (around 04/16/2018) for OB f/u.  Future Appointments  Date Time Provider Department Center  04/06/2018  9:45 AM [facility] US 2 [facility]-US    Levie HeritageJacob J Minka Knight, DO

## 2018-03-21 LAB — AFP TETRA
DIA Mom Value: 0.72
DIA VALUE (EIA): 115.88 pg/mL
DSR (By Age)    1 IN: 51
DSR (SECOND TRIMESTER) 1 IN: 2081
GESTATIONAL AGE AFP: 19.4 wk
MSAFP MOM: 1.9
MSAFP: 96.5 ng/mL
MSHCG MOM: 0.86
MSHCG: 18606 m[IU]/mL
Maternal Age At EDD: 42 yr
Osb Risk: 2021
Test Results:: NEGATIVE
UE3 MOM: 1.31
WEIGHT: 181 [lb_av]
uE3 Value: 2.24 ng/mL

## 2018-03-21 LAB — FERRITIN: Ferritin: 229 ng/mL — ABNORMAL HIGH (ref 15–150)

## 2018-03-24 ENCOUNTER — Ambulatory Visit (HOSPITAL_COMMUNITY)
Admission: RE | Admit: 2018-03-24 | Discharge: 2018-03-24 | Disposition: A | Discharge status: Home / Self Care | Payer: 59 | Source: Ambulatory Visit | Attending: Advanced Practice Midwife | Admitting: Advanced Practice Midwife

## 2018-03-24 ENCOUNTER — Encounter (HOSPITAL_COMMUNITY): Payer: Self-pay

## 2018-03-24 DIAGNOSIS — Z3A20 20 weeks gestation of pregnancy: Secondary | ICD-10-CM

## 2018-03-24 DIAGNOSIS — O09522 Supervision of elderly multigravida, second trimester: Secondary | ICD-10-CM

## 2018-03-24 DIAGNOSIS — Z363 Encounter for antenatal screening for malformations: Secondary | ICD-10-CM | POA: Diagnosis not present

## 2018-04-02 ENCOUNTER — Other Ambulatory Visit: Payer: Self-pay

## 2018-04-02 ENCOUNTER — Other Ambulatory Visit (HOSPITAL_COMMUNITY): Payer: Self-pay | Admitting: Advanced Practice Midwife

## 2018-04-06 ENCOUNTER — Ambulatory Visit (HOSPITAL_COMMUNITY)
Admission: RE | Admit: 2018-04-06 | Discharge: 2018-04-06 | Disposition: A | Discharge status: Home / Self Care | Payer: 59 | Source: Ambulatory Visit | Attending: Family Medicine | Admitting: Family Medicine

## 2018-04-06 ENCOUNTER — Encounter (HOSPITAL_COMMUNITY): Payer: Self-pay

## 2018-04-06 ENCOUNTER — Other Ambulatory Visit (HOSPITAL_COMMUNITY): Payer: Self-pay | Admitting: *Deleted

## 2018-04-06 DIAGNOSIS — O09522 Supervision of elderly multigravida, second trimester: Secondary | ICD-10-CM | POA: Diagnosis not present

## 2018-04-06 DIAGNOSIS — Z362 Encounter for other antenatal screening follow-up: Secondary | ICD-10-CM

## 2018-04-06 DIAGNOSIS — O09529 Supervision of elderly multigravida, unspecified trimester: Secondary | ICD-10-CM

## 2018-04-06 DIAGNOSIS — Z3A22 22 weeks gestation of pregnancy: Secondary | ICD-10-CM

## 2018-04-08 ENCOUNTER — Telehealth (HOSPITAL_COMMUNITY): Payer: Self-pay | Admitting: *Deleted

## 2018-04-08 NOTE — Telephone Encounter (Signed)
Name and DOB verified, results from amnio given.  Normal AFP and karyotype.  Microarray pending.  Pt voiced understanding.

## 2018-04-16 ENCOUNTER — Ambulatory Visit (INDEPENDENT_AMBULATORY_CARE_PROVIDER_SITE_OTHER): Payer: 59 | Admitting: Family Medicine

## 2018-04-16 VITALS — BP 101/59 | HR 79 | Wt 182.0 lb

## 2018-04-16 DIAGNOSIS — Z3482 Encounter for supervision of other normal pregnancy, second trimester: Secondary | ICD-10-CM

## 2018-04-16 DIAGNOSIS — Z3480 Encounter for supervision of other normal pregnancy, unspecified trimester: Secondary | ICD-10-CM

## 2018-04-16 DIAGNOSIS — L819 Disorder of pigmentation, unspecified: Secondary | ICD-10-CM

## 2018-04-16 DIAGNOSIS — O09522 Supervision of elderly multigravida, second trimester: Secondary | ICD-10-CM

## 2018-04-16 DIAGNOSIS — R7989 Other specified abnormal findings of blood chemistry: Secondary | ICD-10-CM

## 2018-04-16 DIAGNOSIS — O09521 Supervision of elderly multigravida, first trimester: Secondary | ICD-10-CM

## 2018-04-16 MED ORDER — CLOBETASOL PROPIONATE 0.05 % EX CREA: 1.0000 "application " | TOPICAL_CREAM | Freq: Two times a day (BID) | CUTANEOUS | 2 refills | Status: DC

## 2018-04-16 NOTE — Progress Notes (Signed)
   PRENATAL VISIT NOTE  Subjective:  Elizabeth Reese is a 42 y.o. G3P0020 at 727w4d being seen today for ongoing prenatal care.  She is currently monitored for the following issues for this high-risk pregnancy and has Depression; Preventative health care; Family history of breast cancer; Insomnia; Baker's cyst; Supervision of other normal pregnancy, antepartum; AMA (advanced maternal age) multigravida 35+, first trimester; and Elevated ferritin on their problem list.  Patient reports depigmented area on right vulva. Hasn't changed. No itching..  Contractions: Not present. Vag. Bleeding: None.  Movement: Present. Denies leaking of fluid.   The following portions of the patient's history were reviewed and updated as appropriate: allergies, current medications, past family history, past medical history, past social history, past surgical history and problem list. Problem list updated.  Objective:   Vitals:   04/16/18 0949  BP: (!) 101/59  Pulse: 79  Weight: 187 lb (84.8 kg)    Fetal Status: Fetal Heart Rate (bpm): 145   Movement: Present     General:  Alert, oriented and cooperative. Patient is in no acute distress.  Skin: Skin is warm and dry. No rash noted.   Cardiovascular: Normal heart rate noted  Respiratory: Normal respiratory effort, no problems with respiration noted  Abdomen: Soft, gravid, appropriate for gestational age.  Pain/Pressure: Absent     Pelvic: Cervical exam deferred      1.5cm x 3cm depigmented area on right lower inguinal crease. Also has 6 mm area on right medial thigh and 8mm area on left inguinal crease  Extremities: Normal range of motion.  Edema: None  Mental Status: Normal mood and affect. Normal behavior. Normal judgment and thought content.   Assessment and Plan:  Pregnancy: G3P0020 at 2427w4d  1. Supervision of other normal pregnancy, antepartum FHT and FH normal. 28 week labs next visit.  2. Elevated ferritin Improved. Will continue to monitor  3.  AMA (advanced maternal age) multigravida 35+, first trimester Amnio normal. Serial US for growth.  4. Skin depigmentation Looks like vitiligo. Trial of clobetasol.  Preterm labor symptoms and general obstetric precautions including but not limited to vaginal bleeding, contractions, leaking of fluid and fetal movement were reviewed in detail with the patient. Please refer to After Visit Summary for other counseling recommendations.  No follow-ups on file.  Future Appointments  Date Time Provider Department Center  05/07/2018  9:30 AM [facility] US 1 [facility]-US    Levie HeritageJacob J Draedyn Weidinger, DO

## 2018-04-29 NOTE — L&D Delivery Note (Addendum)
Delivery Note At 8:36 AM a viable female was delivered via Vaginal, Spontaneous (Presentation: ROA  ).  APGAR: 9, 9; weight  .   Placenta status: intact, spontaneously delivered Cord: 3V with the following complications: none.  Cord pH: N/A  Anesthesia:  epidural Episiotomy: None Lacerations: 1st degree;Perineal Suture Repair: 3.0 vicryl Est. Blood Loss (mL): 260  Mom to postpartum.  Baby to Couplet care / Skin to Skin.   Follow up Visit: Please schedule this patient for Postpartum visit in: 6 weeks with the following provider: Any provider For C/S patients schedule nurse incision check in weeks 2 weeks: no Low risk pregnancy complicated by: None Delivery mode:  SVD Anticipated Birth Control:  OCPs PP Procedures needed: Routine  Schedule Integrated BH visit: no    Jen Mow, DO 07/30/2018, 9:42 AM

## 2018-05-04 ENCOUNTER — Ambulatory Visit (HOSPITAL_COMMUNITY): Payer: 59

## 2018-05-07 ENCOUNTER — Encounter (HOSPITAL_COMMUNITY): Payer: Self-pay

## 2018-05-07 ENCOUNTER — Ambulatory Visit (HOSPITAL_COMMUNITY)
Admission: RE | Admit: 2018-05-07 | Discharge: 2018-05-07 | Disposition: A | Discharge status: Home / Self Care | Payer: 59 | Source: Ambulatory Visit | Attending: Maternal and Fetal Medicine | Admitting: Maternal and Fetal Medicine

## 2018-05-07 ENCOUNTER — Other Ambulatory Visit (HOSPITAL_COMMUNITY): Payer: Self-pay | Admitting: *Deleted

## 2018-05-07 DIAGNOSIS — Z362 Encounter for other antenatal screening follow-up: Secondary | ICD-10-CM | POA: Diagnosis not present

## 2018-05-07 DIAGNOSIS — O09529 Supervision of elderly multigravida, unspecified trimester: Secondary | ICD-10-CM | POA: Diagnosis not present

## 2018-05-07 DIAGNOSIS — Z3A26 26 weeks gestation of pregnancy: Secondary | ICD-10-CM

## 2018-05-07 DIAGNOSIS — O09522 Supervision of elderly multigravida, second trimester: Secondary | ICD-10-CM

## 2018-05-07 DIAGNOSIS — O09523 Supervision of elderly multigravida, third trimester: Secondary | ICD-10-CM

## 2018-05-15 ENCOUNTER — Ambulatory Visit (INDEPENDENT_AMBULATORY_CARE_PROVIDER_SITE_OTHER): Payer: 59 | Admitting: Family Medicine

## 2018-05-15 VITALS — BP 106/76 | HR 86 | Wt 181.0 lb

## 2018-05-15 DIAGNOSIS — Z23 Encounter for immunization: Secondary | ICD-10-CM | POA: Diagnosis not present

## 2018-05-15 DIAGNOSIS — Z3480 Encounter for supervision of other normal pregnancy, unspecified trimester: Secondary | ICD-10-CM | POA: Diagnosis not present

## 2018-05-15 DIAGNOSIS — O09521 Supervision of elderly multigravida, first trimester: Secondary | ICD-10-CM

## 2018-05-15 NOTE — Addendum Note (Signed)
Addended by: Anell Barr on: 05/15/2018 09:13 AM   Modules accepted: Orders

## 2018-05-15 NOTE — Progress Notes (Signed)
   PRENATAL VISIT NOTE  Subjective:  Elizabeth Reese is a 43 y.o. G3P0020 at [redacted]w[redacted]d being seen today for ongoing prenatal care.  She is currently monitored for the following issues for this high-risk pregnancy and has Depression; Preventative health care; Family history of breast cancer; Insomnia; Baker's cyst; Supervision of other normal pregnancy, antepartum; AMA (advanced maternal age) multigravida 35+, first trimester; and Elevated ferritin on their problem list.  Patient reports no complaints.  Contractions: Not present. Vag. Bleeding: None.  Movement: Present. Denies leaking of fluid.   The following portions of the patient's history were reviewed and updated as appropriate: allergies, current medications, past family history, past medical history, past social history, past surgical history and problem list. Problem list updated.  Objective:   Vitals:   05/15/18 0844  BP: 106/76  Pulse: 86  Weight: 181 lb (82.1 kg)    Fetal Status: Fetal Heart Rate (bpm): 145 Fundal Height: 28 cm Movement: Present     General:  Alert, oriented and cooperative. Patient is in no acute distress.  Skin: Skin is warm and dry. No rash noted.   Cardiovascular: Normal heart rate noted  Respiratory: Normal respiratory effort, no problems with respiration noted  Abdomen: Soft, gravid, appropriate for gestational age.  Pain/Pressure: Absent     Pelvic: Cervical exam deferred        Extremities: Normal range of motion.  Edema: None  Mental Status: Normal mood and affect. Normal behavior. Normal judgment and thought content.   Assessment and Plan:  Pregnancy: G3P0020 at [redacted]w[redacted]d  1. Supervision of other normal pregnancy, antepartum FHT and FH normal - Glucose Tolerance, 2 Hours w/1 Hour - RPR - CBC - HIV antibody (with reflex)  2. AMA (advanced maternal age) multigravida 35+, first trimester - Glucose Tolerance, 2 Hours w/1 Hour - RPR - CBC - HIV antibody (with reflex)  Preterm labor symptoms and  general obstetric precautions including but not limited to vaginal bleeding, contractions, leaking of fluid and fetal movement were reviewed in detail with the patient. Please refer to After Visit Summary for other counseling recommendations.  Return in about 4 weeks (around 06/12/2018) for OB f/u.  Future Appointments  Date Time Provider Department Center  07/16/2018 10:00 AM [facility] [place] 3 [facility]-US  07/23/2018 10:00 AM [facility] [place] 3 [facility]-US  07/30/2018 10:00 AM [facility] [place] 3 WH-M[facility] M[facility]-US    Levie Heritage, DO

## 2018-05-16 LAB — CBC
HEMATOCRIT: 30 % — AB (ref 34.0–46.6)
Hemoglobin: 10.4 g/dL — ABNORMAL LOW (ref 11.1–15.9)
MCH: 31 pg (ref 26.6–33.0)
MCHC: 34.7 g/dL (ref 31.5–35.7)
MCV: 90 fL (ref 79–97)
PLATELETS: 262 10*3/uL (ref 150–450)
RBC: 3.35 x10E6/uL — ABNORMAL LOW (ref 3.77–5.28)
RDW: 12.7 % (ref 11.7–15.4)
WBC: 5.3 10*3/uL (ref 3.4–10.8)

## 2018-05-16 LAB — RPR: RPR: NONREACTIVE

## 2018-05-16 LAB — HIV ANTIBODY (ROUTINE TESTING W REFLEX): HIV Screen 4th Generation wRfx: NONREACTIVE

## 2018-05-16 LAB — GLUCOSE TOLERANCE, 2 HOURS W/ 1HR
GLUCOSE, 1 HOUR: 81 mg/dL (ref 65–179)
GLUCOSE, 2 HOUR: 83 mg/dL (ref 65–152)
GLUCOSE, FASTING: 76 mg/dL (ref 65–91)

## 2018-05-20 ENCOUNTER — Telehealth: Payer: Self-pay

## 2018-05-20 NOTE — Telephone Encounter (Signed)
Patient called wondering what iron she should get for her anemia. Informed her she could get ferrous sulfate over the counters and did warn her to start stool softener now.   Also instructed to include dark green veggies are high in iron. Can drink glass of OJ with iron pills daily so that it helps with absorption. Patient states understanding. Armandina Stammer RN

## 2018-06-12 ENCOUNTER — Encounter: Payer: Self-pay | Admitting: Family Medicine

## 2018-06-18 ENCOUNTER — Ambulatory Visit (INDEPENDENT_AMBULATORY_CARE_PROVIDER_SITE_OTHER): Payer: 59 | Admitting: Family Medicine

## 2018-06-18 VITALS — BP 106/67 | HR 98 | Wt 179.0 lb

## 2018-06-18 DIAGNOSIS — O09523 Supervision of elderly multigravida, third trimester: Secondary | ICD-10-CM

## 2018-06-18 DIAGNOSIS — O09521 Supervision of elderly multigravida, first trimester: Secondary | ICD-10-CM

## 2018-06-18 DIAGNOSIS — Z3483 Encounter for supervision of other normal pregnancy, third trimester: Secondary | ICD-10-CM

## 2018-06-18 DIAGNOSIS — D509 Iron deficiency anemia, unspecified: Secondary | ICD-10-CM

## 2018-06-18 DIAGNOSIS — Z3A32 32 weeks gestation of pregnancy: Secondary | ICD-10-CM

## 2018-06-18 DIAGNOSIS — Z3480 Encounter for supervision of other normal pregnancy, unspecified trimester: Secondary | ICD-10-CM

## 2018-06-18 NOTE — Progress Notes (Signed)
   PRENATAL VISIT NOTE  Subjective:  Elizabeth Reese is a 43 y.o. G3P0020 at [redacted]w[redacted]d being seen today for ongoing prenatal care.  She is currently monitored for the following issues for this high-risk pregnancy and has Depression; Preventative health care; Family history of breast cancer; Insomnia; Baker's cyst; Supervision of other normal pregnancy, antepartum; AMA (advanced maternal age) multigravida 35+, first trimester; and Elevated ferritin on their problem list.  Patient reports no complaints.  Contractions: Not present. Vag. Bleeding: Scant.  Movement: Present. Denies leaking of fluid.   The following portions of the patient's history were reviewed and updated as appropriate: allergies, current medications, past family history, past medical history, past social history, past surgical history and problem list. Problem list updated.  Objective:   Vitals:   06/18/18 0902  BP: 106/67  Pulse: 98  Weight: 179 lb (81.2 kg)    Fetal Status: Fetal Heart Rate (bpm): 135   Movement: Present     General:  Alert, oriented and cooperative. Patient is in no acute distress.  Skin: Skin is warm and dry. No rash noted.   Cardiovascular: Normal heart rate noted  Respiratory: Normal respiratory effort, no problems with respiration noted  Abdomen: Soft, gravid, appropriate for gestational age.  Pain/Pressure: Present     Pelvic: Cervical exam deferred        Extremities: Normal range of motion.  Edema: None  Mental Status: Normal mood and affect. Normal behavior. Normal judgment and thought content.   Assessment and Plan:  Pregnancy: G3P0020 at [redacted]w[redacted]d  1. Supervision of other normal pregnancy, antepartum FHT and FH normal  2. AMA (advanced maternal age) multigravida 35+, first trimester Growth [place] 3/19  3. Iron deficiency anemia, unspecified iron deficiency anemia type Continue iron supplementation  Preterm labor symptoms and general obstetric precautions including but not limited to  vaginal bleeding, contractions, leaking of fluid and fetal movement were reviewed in detail with the patient. Please refer to After Visit Summary for other counseling recommendations.  Return in about 2 weeks (around 07/02/2018).  Future Appointments  Date Time Provider Department Center  07/03/2018  9:15 AM Levie Heritage, DO [facility] None  07/16/2018 10:00 AM [facility] [place] 3 WH-MFCUS MFC-US  07/23/2018 10:00 AM [facility] [place] 3 [facility]-MFCUS MFC-US  07/30/2018 10:00 AM [facility] [place] 3 WH-MFCUS MFC-US    Levie Heritage, DO

## 2018-07-03 ENCOUNTER — Ambulatory Visit (INDEPENDENT_AMBULATORY_CARE_PROVIDER_SITE_OTHER): Payer: 59 | Admitting: Family Medicine

## 2018-07-03 VITALS — BP 102/69 | HR 91 | Wt 179.0 lb

## 2018-07-03 DIAGNOSIS — O09521 Supervision of elderly multigravida, first trimester: Secondary | ICD-10-CM

## 2018-07-03 DIAGNOSIS — Z3A34 34 weeks gestation of pregnancy: Secondary | ICD-10-CM

## 2018-07-03 DIAGNOSIS — O09523 Supervision of elderly multigravida, third trimester: Secondary | ICD-10-CM

## 2018-07-03 DIAGNOSIS — Z3480 Encounter for supervision of other normal pregnancy, unspecified trimester: Secondary | ICD-10-CM

## 2018-07-03 DIAGNOSIS — D509 Iron deficiency anemia, unspecified: Secondary | ICD-10-CM

## 2018-07-03 NOTE — Progress Notes (Signed)
   PRENATAL VISIT NOTE  Subjective:  Elizabeth Reese is a 43 y.o. G3P0020 at [redacted]w[redacted]d being seen today for ongoing prenatal care.  She is currently monitored for the following issues for this high-risk pregnancy and has Depression; Preventative health care; Family history of breast cancer; Insomnia; Baker's cyst; Supervision of other normal pregnancy, antepartum; AMA (advanced maternal age) multigravida 35+, first trimester; and Elevated ferritin on their problem list.  Patient reports no complaints.  Contractions: Irritability. Vag. Bleeding: None.  Movement: Present. Denies leaking of fluid.   The following portions of the patient's history were reviewed and updated as appropriate: allergies, current medications, past family history, past medical history, past social history, past surgical history and problem list. Problem list updated.  Objective:   Vitals:   07/03/18 0931  BP: 102/69  Pulse: 91  Weight: 179 lb (81.2 kg)    Fetal Status: Fetal Heart Rate (bpm): 145   Movement: Present     General:  Alert, oriented and cooperative. Patient is in no acute distress.  Skin: Skin is warm and dry. No rash noted.   Cardiovascular: Normal heart rate noted  Respiratory: Normal respiratory effort, no problems with respiration noted  Abdomen: Soft, gravid, appropriate for gestational age.  Pain/Pressure: Present     Pelvic: Cervical exam deferred        Extremities: Normal range of motion.  Edema: None  Mental Status: Normal mood and affect. Normal behavior. Normal judgment and thought content.   Assessment and Plan:  Pregnancy: G3P0020 at [redacted]w[redacted]d  1. Supervision of other normal pregnancy, antepartum FHt and FH normal  2. AMA (advanced maternal age) multigravida 35+, first trimester [place] scheduled 3/19 for growth and BPp  3. Iron deficiency anemia, unspecified iron deficiency anemia type Continue iron  Preterm labor symptoms and general obstetric precautions including but not limited to  vaginal bleeding, contractions, leaking of fluid and fetal movement were reviewed in detail with the patient. Please refer to After Visit Summary for other counseling recommendations.  Return in about 2 weeks (around 07/17/2018).  Future Appointments  Date Time Provider Department Center  07/16/2018 10:00 AM [facility] [place] 3 [facility]-MFC[facility] MFC-US  07/17/2018  9:15 AM Levie Heritage, DO [facility] None  07/23/2018 10:00 AM [facility] [place] 3 WH-MFCUS MFC-US  07/30/2018 10:00 AM [facility] [place] 3 WH-MFCUS MFC-US    Levie Heritage, DO

## 2018-07-16 ENCOUNTER — Ambulatory Visit (HOSPITAL_COMMUNITY)
Admission: RE | Admit: 2018-07-16 | Discharge: 2018-07-16 | Disposition: A | Discharge status: Home / Self Care | Payer: 59 | Source: Ambulatory Visit | Attending: Family Medicine | Admitting: Family Medicine

## 2018-07-16 ENCOUNTER — Encounter (HOSPITAL_COMMUNITY): Payer: Self-pay

## 2018-07-16 ENCOUNTER — Other Ambulatory Visit: Payer: Self-pay

## 2018-07-16 ENCOUNTER — Other Ambulatory Visit (HOSPITAL_COMMUNITY): Payer: Self-pay | Admitting: Obstetrics and Gynecology

## 2018-07-16 ENCOUNTER — Ambulatory Visit (HOSPITAL_COMMUNITY): Payer: 59 | Admitting: *Deleted

## 2018-07-16 ENCOUNTER — Telehealth: Payer: Self-pay

## 2018-07-16 VITALS — BP 108/75 | HR 87 | Temp 98.0°F | Wt 178.2 lb

## 2018-07-16 DIAGNOSIS — O09523 Supervision of elderly multigravida, third trimester: Secondary | ICD-10-CM | POA: Diagnosis present

## 2018-07-16 DIAGNOSIS — Z362 Encounter for other antenatal screening follow-up: Secondary | ICD-10-CM

## 2018-07-16 DIAGNOSIS — O365990 Maternal care for other known or suspected poor fetal growth, unspecified trimester, not applicable or unspecified: Secondary | ICD-10-CM

## 2018-07-16 DIAGNOSIS — Z3A36 36 weeks gestation of pregnancy: Secondary | ICD-10-CM

## 2018-07-16 NOTE — Telephone Encounter (Signed)
Patient called stating that she is 36 weeks. Patient states she has an ultrasound today and is thinking about not going. Made patient aware it is to check the fluid around baby and the growth of baby.  Patient reports good fetal movement. Did inform patient that they are only allowing the patient to enter the building, like we are for all appointments as well.  Patient states understanding. Patient states she will think about it and call MFM if she wants to cancel. Armandina Stammer RN

## 2018-07-17 ENCOUNTER — Ambulatory Visit (INDEPENDENT_AMBULATORY_CARE_PROVIDER_SITE_OTHER): Payer: 59 | Admitting: Family Medicine

## 2018-07-17 VITALS — BP 102/68 | HR 101 | Wt 177.0 lb

## 2018-07-17 DIAGNOSIS — Z3480 Encounter for supervision of other normal pregnancy, unspecified trimester: Secondary | ICD-10-CM

## 2018-07-17 DIAGNOSIS — O09523 Supervision of elderly multigravida, third trimester: Secondary | ICD-10-CM

## 2018-07-17 DIAGNOSIS — Z113 Encounter for screening for infections with a predominantly sexual mode of transmission: Secondary | ICD-10-CM | POA: Diagnosis not present

## 2018-07-17 DIAGNOSIS — D509 Iron deficiency anemia, unspecified: Secondary | ICD-10-CM

## 2018-07-17 DIAGNOSIS — Z3A36 36 weeks gestation of pregnancy: Secondary | ICD-10-CM

## 2018-07-17 DIAGNOSIS — Z3490 Encounter for supervision of normal pregnancy, unspecified, unspecified trimester: Secondary | ICD-10-CM | POA: Diagnosis not present

## 2018-07-17 DIAGNOSIS — O09521 Supervision of elderly multigravida, first trimester: Secondary | ICD-10-CM

## 2018-07-17 MED ORDER — MISC. DEVICES MISC: 1.0000 | Freq: Every day | 0 refills | Status: DC

## 2018-07-17 NOTE — Progress Notes (Signed)
   PRENATAL VISIT NOTE  Subjective:  Elizabeth Reese is a 43 y.o. G3P0020 at [redacted]w[redacted]d being seen today for ongoing prenatal care.  She is currently monitored for the following issues for this high-risk pregnancy and has Depression; Preventative health care; Family history of breast cancer; Insomnia; Baker's cyst; Supervision of other normal pregnancy, antepartum; AMA (advanced maternal age) multigravida 35+, first trimester; and Elevated ferritin on their problem list.  Patient reports no complaints.  Contractions: Irritability. Vag. Bleeding: None.  Movement: Present. Denies leaking of fluid.   The following portions of the patient's history were reviewed and updated as appropriate: allergies, current medications, past family history, past medical history, past social history, past surgical history and problem list.   Objective:   Vitals:   07/17/18 0920  BP: 102/68  Pulse: (!) 101  Weight: 177 lb (80.3 kg)    Fetal Status:   Fundal Height: 35 cm Movement: Present     General:  Alert, oriented and cooperative. Patient is in no acute distress.  Skin: Skin is warm and dry. No rash noted.   Cardiovascular: Normal heart rate noted  Respiratory: Normal respiratory effort, no problems with respiration noted  Abdomen: Soft, gravid, appropriate for gestational age.  Pain/Pressure: Present     Pelvic: Cervical exam deferred        Extremities: Normal range of motion.  Edema: None  Mental Status: Normal mood and affect. Normal behavior. Normal judgment and thought content.   Assessment and Plan:  Pregnancy: G3P0020 at [redacted]w[redacted]d 1. Supervision of other normal pregnancy, antepartum FHT and FH normal  2. Prenatal care, antepartum - Culture, beta strep (group b only) - GC/Chlamydia probe amp ([place])not at [facility]  3. AMA (advanced maternal age) multigravida 35+, first trimester MFM recommended induction at 39 weeks  4. Iron deficiency anemia, unspecified iron deficiency anemia type    Preterm labor symptoms and general obstetric precautions including but not limited to vaginal bleeding, contractions, leaking of fluid and fetal movement were reviewed in detail with the patient. Please refer to After Visit Summary for other counseling recommendations.   Return in about 1 week (around 07/24/2018).  Future Appointments  Date Time Provider Department Center  07/23/2018  9:45 AM [facility] NURSE WH-MFC MFC-US  07/23/2018 10:00 AM [facility]-MFC [place] 3 WH-MFCUS MFC-US  07/30/2018  9:45 AM [facility]-MFC NURSE WH-MFC MFC-US  07/30/2018 10:00 AM [facility]-MFC [place] 3 WH-MFCUS MFC-US    Levie Heritage, DO

## 2018-07-17 NOTE — Addendum Note (Signed)
Addended by: Anell Barr on: 07/17/2018 10:21 AM   Modules accepted: Orders

## 2018-07-20 ENCOUNTER — Telehealth: Payer: Self-pay

## 2018-07-20 LAB — CULTURE, BETA STREP (GROUP B ONLY): Strep Gp B Culture: POSITIVE — AB

## 2018-07-20 LAB — GC/CHLAMYDIA PROBE AMP (~~LOC~~) NOT AT ARMC
Chlamydia: NEGATIVE
NEISSERIA GONORRHEA: NEGATIVE

## 2018-07-20 NOTE — Telephone Encounter (Signed)
Patient scheduled for induction on 08-02-2018 at 7:30 am  Orders faxed to labor and delivery. Armandina Stammer RN

## 2018-07-23 ENCOUNTER — Ambulatory Visit (HOSPITAL_COMMUNITY)
Admission: RE | Admit: 2018-07-23 | Discharge: 2018-07-23 | Disposition: A | Discharge status: Home / Self Care | Payer: 59 | Source: Ambulatory Visit | Attending: Family Medicine | Admitting: Family Medicine

## 2018-07-23 ENCOUNTER — Encounter (HOSPITAL_COMMUNITY): Payer: Self-pay

## 2018-07-23 ENCOUNTER — Other Ambulatory Visit: Payer: Self-pay

## 2018-07-23 ENCOUNTER — Other Ambulatory Visit (HOSPITAL_COMMUNITY): Payer: Self-pay | Admitting: Obstetrics and Gynecology

## 2018-07-23 ENCOUNTER — Ambulatory Visit (HOSPITAL_COMMUNITY): Payer: 59 | Admitting: *Deleted

## 2018-07-23 VITALS — BP 104/74 | HR 104 | Temp 98.6°F

## 2018-07-23 DIAGNOSIS — O09523 Supervision of elderly multigravida, third trimester: Secondary | ICD-10-CM

## 2018-07-23 DIAGNOSIS — Z3A37 37 weeks gestation of pregnancy: Secondary | ICD-10-CM | POA: Diagnosis not present

## 2018-07-23 DIAGNOSIS — O365990 Maternal care for other known or suspected poor fetal growth, unspecified trimester, not applicable or unspecified: Secondary | ICD-10-CM

## 2018-07-24 ENCOUNTER — Ambulatory Visit (INDEPENDENT_AMBULATORY_CARE_PROVIDER_SITE_OTHER): Payer: 59 | Admitting: Family Medicine

## 2018-07-24 ENCOUNTER — Encounter: Payer: Self-pay | Admitting: Family Medicine

## 2018-07-24 DIAGNOSIS — O09521 Supervision of elderly multigravida, first trimester: Secondary | ICD-10-CM

## 2018-07-24 DIAGNOSIS — Z3480 Encounter for supervision of other normal pregnancy, unspecified trimester: Secondary | ICD-10-CM

## 2018-07-24 DIAGNOSIS — Z3A37 37 weeks gestation of pregnancy: Secondary | ICD-10-CM

## 2018-07-24 NOTE — Progress Notes (Signed)
   TELEHEALTH VIRTUAL OBSTETRICS VISIT ENCOUNTER NOTE  I connected with Elizabeth Reese on 07/24/18 at  9:30 AM EDT by telephone at home and verified that I am speaking with the correct person using two identifiers.   I discussed the limitations, risks, security and privacy concerns of performing an evaluation and management service by telephone and the availability of in person appointments. I also discussed with the patient that there may be a patient responsible charge related to this service. The patient expressed understanding and agreed to proceed.  Subjective:  Elizabeth Reese is a 43 y.o. G3P0020 at [redacted]w[redacted]d being followed for ongoing prenatal care.  She is currently monitored for the following issues for this high-risk pregnancy and has Depression; Preventative health care; Family history of breast cancer; Insomnia; Baker's cyst; Supervision of other normal pregnancy, antepartum; AMA (advanced maternal age) multigravida 35+, first trimester; and Elevated ferritin on their problem list.  Patient reports no complaints. Reports fetal movement. Denies any contractions, bleeding or leaking of fluid.   The following portions of the patient's history were reviewed and updated as appropriate: allergies, current medications, past family history, past medical history, past social history, past surgical history and problem list.   Objective:   General:  Alert, oriented and cooperative.   Mental Status: Normal mood and affect perceived. Normal judgment and thought content.  Rest of physical exam deferred due to type of encounter  Assessment and Plan:  Pregnancy: G3P0020 at [redacted]w[redacted]d  1. Supervision of other normal pregnancy, antepartum Fetal movement normal  2. AMA (advanced maternal age) multigravida 35+, first trimester Low risk nips   Term labor symptoms and general obstetric precautions including but not limited to vaginal bleeding, contractions, leaking of fluid and fetal movement were  reviewed in detail with the patient.  I discussed the assessment and treatment plan with the patient. The patient was provided an opportunity to ask questions and all were answered. The patient agreed with the plan and demonstrated an understanding of the instructions. The patient was advised to call back or seek an in-person office evaluation/go to MAU at [facility] for any urgent or concerning symptoms. Please refer to After Visit Summary for other counseling recommendations.   No follow-ups on file.  Future Appointments  Date Time Provider Department Center  07/30/2018  9:45 AM WH-MFC NURSE WH-MFC MFC-US  07/30/2018 10:00 AM WH-MFC [place] 3 WH-MFCUS MFC-US  08/03/2018  7:30 AM MC-LD SCHED ROOM MC-INDC None    Levie Heritage, DO Center for [employer], [facility]

## 2018-07-28 ENCOUNTER — Telehealth (HOSPITAL_COMMUNITY): Payer: Self-pay | Admitting: *Deleted

## 2018-07-28 ENCOUNTER — Other Ambulatory Visit: Payer: Self-pay | Admitting: Advanced Practice Midwife

## 2018-07-28 ENCOUNTER — Other Ambulatory Visit: Payer: Self-pay | Admitting: Family Medicine

## 2018-07-28 ENCOUNTER — Encounter (HOSPITAL_COMMUNITY): Payer: Self-pay | Admitting: *Deleted

## 2018-07-28 NOTE — Telephone Encounter (Signed)
Preadmission screen  

## 2018-07-29 ENCOUNTER — Encounter (HOSPITAL_COMMUNITY): Payer: Self-pay | Admitting: *Deleted

## 2018-07-29 ENCOUNTER — Inpatient Hospital Stay (HOSPITAL_COMMUNITY)
Admission: AD | Admit: 2018-07-29 | Discharge: 2018-07-31 | DRG: 807 | Disposition: A | Discharge status: Home / Self Care | Payer: 59 | Attending: Obstetrics and Gynecology | Admitting: Obstetrics and Gynecology

## 2018-07-29 ENCOUNTER — Inpatient Hospital Stay (HOSPITAL_COMMUNITY): Payer: 59 | Admitting: Anesthesiology

## 2018-07-29 ENCOUNTER — Other Ambulatory Visit: Payer: Self-pay

## 2018-07-29 ENCOUNTER — Inpatient Hospital Stay (EMERGENCY_DEPARTMENT_HOSPITAL)
Admission: AD | Admit: 2018-07-29 | Discharge: 2018-07-29 | Disposition: A | Discharge status: Home / Self Care | Payer: 59 | Source: Home / Self Care | Attending: Obstetrics & Gynecology | Admitting: Obstetrics & Gynecology

## 2018-07-29 DIAGNOSIS — O09521 Supervision of elderly multigravida, first trimester: Secondary | ICD-10-CM

## 2018-07-29 DIAGNOSIS — Z3480 Encounter for supervision of other normal pregnancy, unspecified trimester: Secondary | ICD-10-CM

## 2018-07-29 DIAGNOSIS — O479 False labor, unspecified: Secondary | ICD-10-CM

## 2018-07-29 DIAGNOSIS — O471 False labor at or after 37 completed weeks of gestation: Secondary | ICD-10-CM | POA: Insufficient documentation

## 2018-07-29 DIAGNOSIS — Z3A38 38 weeks gestation of pregnancy: Secondary | ICD-10-CM

## 2018-07-29 DIAGNOSIS — Z3490 Encounter for supervision of normal pregnancy, unspecified, unspecified trimester: Secondary | ICD-10-CM | POA: Diagnosis present

## 2018-07-29 DIAGNOSIS — O4292 Full-term premature rupture of membranes, unspecified as to length of time between rupture and onset of labor: Principal | ICD-10-CM | POA: Diagnosis present

## 2018-07-29 DIAGNOSIS — O4202 Full-term premature rupture of membranes, onset of labor within 24 hours of rupture: Secondary | ICD-10-CM | POA: Diagnosis not present

## 2018-07-29 DIAGNOSIS — Z87891 Personal history of nicotine dependence: Secondary | ICD-10-CM

## 2018-07-29 DIAGNOSIS — O26893 Other specified pregnancy related conditions, third trimester: Secondary | ICD-10-CM | POA: Insufficient documentation

## 2018-07-29 DIAGNOSIS — O99824 Streptococcus B carrier state complicating childbirth: Secondary | ICD-10-CM | POA: Diagnosis not present

## 2018-07-29 DIAGNOSIS — O4703 False labor before 37 completed weeks of gestation, third trimester: Secondary | ICD-10-CM

## 2018-07-29 DIAGNOSIS — Z0371 Encounter for suspected problem with amniotic cavity and membrane ruled out: Secondary | ICD-10-CM | POA: Diagnosis not present

## 2018-07-29 LAB — TYPE AND SCREEN
ABO/RH(D): O POS
Antibody Screen: NEGATIVE

## 2018-07-29 LAB — CBC
HCT: 37.8 % (ref 36.0–46.0)
Hemoglobin: 12.4 g/dL (ref 12.0–15.0)
MCH: 30 pg (ref 26.0–34.0)
MCHC: 32.8 g/dL (ref 30.0–36.0)
MCV: 91.3 fL (ref 80.0–100.0)
Platelets: 318 10*3/uL (ref 150–400)
RBC: 4.14 MIL/uL (ref 3.87–5.11)
RDW: 13 % (ref 11.5–15.5)
WBC: 8.4 10*3/uL (ref 4.0–10.5)
nRBC: 0 % (ref 0.0–0.2)

## 2018-07-29 LAB — POCT FERN TEST
POCT Fern Test: NEGATIVE
POCT Fern Test: NEGATIVE
POCT Fern Test: POSITIVE

## 2018-07-29 MED ORDER — OXYCODONE-ACETAMINOPHEN 5-325 MG PO TABS: 2.0000 | ORAL_TABLET | ORAL | Status: DC | PRN

## 2018-07-29 MED ORDER — LACTATED RINGERS IV SOLN
500.0000 mL | Freq: Once | INTRAVENOUS | Status: AC
  Administered 2018-07-29: 500 mL via INTRAVENOUS

## 2018-07-29 MED ORDER — NALBUPHINE HCL 10 MG/ML IJ SOLN
10.0000 mg | Freq: Once | INTRAMUSCULAR | Status: AC
  Administered 2018-07-29: 10 mg via INTRAMUSCULAR
  Filled 2018-07-29: qty 1

## 2018-07-29 MED ORDER — ACETAMINOPHEN 325 MG PO TABS: 650.0000 mg | ORAL_TABLET | ORAL | Status: DC | PRN

## 2018-07-29 MED ORDER — PHENYLEPHRINE 40 MCG/ML (10ML) SYRINGE FOR IV PUSH (FOR BLOOD PRESSURE SUPPORT): 80.0000 ug | PREFILLED_SYRINGE | INTRAVENOUS | Status: DC | PRN

## 2018-07-29 MED ORDER — LACTATED RINGERS IV SOLN
INTRAVENOUS | Status: DC
  Administered 2018-07-29 – 2018-07-30 (×3): via INTRAVENOUS

## 2018-07-29 MED ORDER — MISOPROSTOL 25 MCG QUARTER TABLET: 25.0000 ug | ORAL_TABLET | ORAL | Status: DC | PRN

## 2018-07-29 MED ORDER — PENICILLIN G 3 MILLION UNITS IVPB - SIMPLE MED
3.0000 10*6.[IU] | INTRAVENOUS | Status: DC
  Administered 2018-07-29 – 2018-07-30 (×3): 3 10*6.[IU] via INTRAVENOUS
  Filled 2018-07-29 (×11): qty 100

## 2018-07-29 MED ORDER — DIPHENHYDRAMINE HCL 50 MG/ML IJ SOLN: 12.5000 mg | INTRAMUSCULAR | Status: DC | PRN

## 2018-07-29 MED ORDER — LIDOCAINE HCL (PF) 1 % IJ SOLN: 30.0000 mL | INTRAMUSCULAR | Status: DC | PRN

## 2018-07-29 MED ORDER — LACTATED RINGERS IV SOLN: 500.0000 mL | Freq: Once | INTRAVENOUS | Status: DC

## 2018-07-29 MED ORDER — TERBUTALINE SULFATE 1 MG/ML IJ SOLN: 0.2500 mg | Freq: Once | INTRAMUSCULAR | Status: DC | PRN

## 2018-07-29 MED ORDER — OXYTOCIN BOLUS FROM INFUSION
500.0000 mL | Freq: Once | INTRAVENOUS | Status: AC
  Administered 2018-07-30: 500 mL via INTRAVENOUS

## 2018-07-29 MED ORDER — SOD CITRATE-CITRIC ACID 500-334 MG/5ML PO SOLN
30.0000 mL | ORAL | Status: DC | PRN
  Administered 2018-07-29 – 2018-07-30 (×2): 30 mL via ORAL
  Filled 2018-07-29 (×3): qty 15
  Filled 2018-07-29: qty 30
  Filled 2018-07-29: qty 15

## 2018-07-29 MED ORDER — FENTANYL-BUPIVACAINE-NACL 0.5-0.125-0.9 MG/250ML-% EP SOLN: 12.0000 mL/h | EPIDURAL | Status: DC | PRN

## 2018-07-29 MED ORDER — OXYCODONE-ACETAMINOPHEN 5-325 MG PO TABS: 1.0000 | ORAL_TABLET | ORAL | Status: DC | PRN

## 2018-07-29 MED ORDER — LACTATED RINGERS IV SOLN: 500.0000 mL | INTRAVENOUS | Status: DC | PRN

## 2018-07-29 MED ORDER — SODIUM CHLORIDE (PF) 0.9 % IJ SOLN
INTRAMUSCULAR | Status: DC | PRN
  Administered 2018-07-29: 12 mL/h via EPIDURAL

## 2018-07-29 MED ORDER — EPHEDRINE 5 MG/ML INJ: 10.0000 mg | INTRAVENOUS | Status: DC | PRN

## 2018-07-29 MED ORDER — LIDOCAINE HCL (PF) 1 % IJ SOLN
INTRAMUSCULAR | Status: DC | PRN
  Administered 2018-07-29 (×2): 5 mL via EPIDURAL

## 2018-07-29 MED ORDER — SODIUM CHLORIDE (PF) 0.9 % IJ SOLN: INTRAMUSCULAR | Status: DC | PRN

## 2018-07-29 MED ORDER — FENTANYL-BUPIVACAINE-NACL 0.5-0.125-0.9 MG/250ML-% EP SOLN
12.0000 mL/h | EPIDURAL | Status: DC | PRN
  Filled 2018-07-29: qty 250

## 2018-07-29 MED ORDER — ONDANSETRON HCL 4 MG/2ML IJ SOLN: 4.0000 mg | Freq: Four times a day (QID) | INTRAMUSCULAR | Status: DC | PRN

## 2018-07-29 MED ORDER — OXYTOCIN 40 UNITS IN NORMAL SALINE INFUSION - SIMPLE MED
2.5000 [IU]/h | INTRAVENOUS | Status: DC
  Filled 2018-07-29: qty 1000

## 2018-07-29 MED ORDER — SODIUM CHLORIDE 0.9 % IV SOLN
5.0000 10*6.[IU] | Freq: Once | INTRAVENOUS | Status: AC
  Administered 2018-07-29: 5 10*6.[IU] via INTRAVENOUS
  Filled 2018-07-29: qty 5

## 2018-07-29 MED ORDER — FENTANYL CITRATE (PF) 100 MCG/2ML IJ SOLN
100.0000 ug | INTRAMUSCULAR | Status: DC | PRN
  Administered 2018-07-29: 100 ug via INTRAVENOUS
  Filled 2018-07-29: qty 2

## 2018-07-29 NOTE — MAU Provider Note (Signed)
First Provider Initiated Contact with Patient 07/29/18 [phone]     S: Ms. Elizabeth Reese is a 43 y.o. G3P0020 at [redacted]w[redacted]d  who presents to MAU today complaining of leaking of fluid since "this morning", reports losing her mucous plug last night and since this morning has noticed her panty liner being wet, non odorous and clear fluid. She endorses vaginal bleeding- bloody show. She endorses contractions. She reports normal fetal movement.    O: BP 96/67 (BP Location: Right Arm)   Pulse 80   Temp 98.6 F (37 C) (Oral)   Resp 18   Ht 5' 5.5" (1.664 m)   Wt 80.6 kg   LMP 10/21/2017 (Exact Date)   SpO2 98%   BMI 29.14 kg/m  GENERAL: Well-developed, well-nourished female in no acute distress.  HEAD: Normocephalic, atraumatic.  CHEST: Normal effort of breathing, regular heart rate ABDOMEN: Soft, nontender, gravid PELVIC: Normal external female genitalia. Vagina is pink and rugated. Cervix with normal contour, no lesions. Normal discharge, bloody show present.  Negative pooling.   10MG  Nubain IM given for 7/10 reported pain, plan to recheck cervix    Cervical exam: No cervical change over 3 hours  Dilation: 1.5 Effacement (%): 90 Cervical Position: Posterior Station: -1 Presentation: Vertex Exam by:: Leafy Ro, RNC   Fetal Monitoring: Baseline: 115 Variability: moderate  Accelerations: present  Decelerations: none (occasional maternal tracing) Contractions: 2-5/ mild by palpation   Results for orders placed or performed during the hospital encounter of 07/29/18 (from the past 24 hour(s))  POCT fern test     Status: None   Collection Time: 07/29/18  9:25 AM  Result Value Ref Range   POCT Fern Test Negative = intact amniotic membranes   Fern Test     Status: None   Collection Time: 07/29/18  1:57 PM  Result Value Ref Range   POCT Fern Test Negative = intact amniotic membranes    A: SIUP at [redacted]w[redacted]d  Membranes intact False labor- Braxton hicks contractions   P: Discharge  home  Labor precautions and fetal kick counts  Return to MAU as needed  Follow up as scheduled in the office on Friday   Sharyon Cable, [place] 07/29/2018 3:05 PM

## 2018-07-29 NOTE — Progress Notes (Signed)
LABOR PROGRESS NOTE  Elizabeth Reese is a 43 y.o. G3P0020 at [redacted]w[redacted]d  admitted for PROM.   Subjective: Patient comfortable with epidural.   Objective: BP 114/65   Pulse 86   Temp 99.1 F (37.3 C) (Oral)   Resp 18   LMP 10/21/2017 (Exact Date)   SpO2 99%  or  Vitals:   07/29/18 2201 07/29/18 2232 07/29/18 2301 07/29/18 2317  BP: (!) 108/94 95/64 114/65   Pulse: (!) 101 (!) 103 86   Resp: 18 18 18    Temp:    99.1 F (37.3 C)  TempSrc:    Oral  SpO2:         Dilation: 5 Effacement (%): 90 Station: 0 Presentation: Vertex Exam by:: Mertha Baars, RN  FHT: baseline rate 120, moderate varibility, +acel, no decel Toco: q2-5 min   Labs: Lab Results  Component Value Date   WBC 8.4 07/29/2018   HGB 12.4 07/29/2018   HCT 37.8 07/29/2018   MCV 91.3 07/29/2018   PLT 318 07/29/2018    Patient Active Problem List   Diagnosis Date Noted  . Encounter for induction of labor 07/29/2018  . Elevated ferritin 01/08/2018  . Supervision of other normal pregnancy, antepartum 12/23/2017  . AMA (advanced maternal age) multigravida 35+, first trimester 12/23/2017  . Baker's cyst 06/08/2015  . Insomnia 06/08/2014  . Family history of breast cancer 02/24/2013  . Preventative health care 10/04/2012  . Depression 09/28/2012    Assessment / Plan: 43 y.o. G3P0020 at [redacted]w[redacted]d here for PROM at 1500.   Labor: Expectant management. Patient progressing without augmentation. Will try position changes. If stalls in progression, will start augmentation with Pitocin.  Fetal Wellbeing:  Cat I  Pain Control:  Epidural in place  Anticipated MOD:  NSVD   Marcy Siren, D.O. OB Fellow  07/29/2018, 11:43 PM

## 2018-07-29 NOTE — H&P (Signed)
Elizabeth Reese is a 43 y.o. female presenting for PROM. Patient was here for labor eval and was then DC home. While walking out of the hospital she had SROM. She has been having contractions off and on since last night. She states that they have been getting stronger and closer together. She reports normal fetal movement.   OB History    Gravida  3   Para      Term      Preterm      AB  2   Living        SAB  1   TAB  1   Ectopic      Multiple      Live Births             Past Medical History:  Diagnosis Date  . Baker's cyst of knee   . Depression    doing good now  . Migraine    Past Surgical History:  Procedure Laterality Date  . DILATION AND CURETTAGE OF UTERUS    . THERAPEUTIC ABORTION     Family History: family history includes Arthritis in her maternal grandmother; Cancer in her maternal grandfather; Cancer (age of onset: 39) in her maternal grandmother; Diabetes in her maternal uncle; Hypertension in her maternal aunt, maternal grandmother, and mother. Social History:  reports that she has quit smoking. Her smoking use included cigarettes. She has never used smokeless tobacco. She reports previous alcohol use of about 10.0 standard drinks of alcohol per week. She reports that she does not use drugs.     Maternal Diabetes: No Genetic Screening: Normal Maternal Ultrasounds/Referrals: Normal Fetal Ultrasounds or other Referrals:  None Maternal Substance Abuse:  No Significant Maternal Medications:  None Significant Maternal Lab Results:  None Other Comments:  GBS+, AMA, normal amnio   Review of Systems  All other systems reviewed and are negative.  Maternal Medical History:  Reason for admission: Rupture of membranes.   Fetal activity: Perceived fetal activity is normal.   Last perceived fetal movement was within the past hour.      Dilation: 2 Effacement (%): 90 Station: 0 Exam by:: Mathews Robinsons, CNM Blood pressure 116/79, pulse 97, temperature  97.6 F (36.4 C), temperature source Oral, resp. rate 18, last menstrual period 10/21/2017, SpO2 100 %. Exam Physical Exam  Nursing note and vitals reviewed. Constitutional: She is oriented to person, place, and time. She appears well-developed and well-nourished. No distress.  HENT:  Head: Normocephalic.  Cardiovascular: Normal rate.  Respiratory: Effort normal.  GI: Soft. There is no abdominal tenderness. There is no rebound.  Neurological: She is alert and oriented to person, place, and time.  Skin: Skin is warm and dry.  Psychiatric: She has a normal mood and affect.    Prenatal labs: ABO, Rh: --/--/O POS, O POS Performed at [facility], [address] [place], [place] [address]  [phone]) Antibody: NEG (04/01 1612) Rubella: 2.42 (08/27 1105) RPR: Non Reactive (01/17 0924)  HBsAg: Negative (08/27 1105)  HIV: Non Reactive (01/17 0924)  GBS:   Positive   Assessment/Plan: 43 y.o. G3P0020 at [redacted]w[redacted]d  PROM Admit to labor and delivery  PCN for GBS Epidural PRN  Anticipate NSVD  Thressa Sheller DNP, CNM  07/29/18  6:38 PM

## 2018-07-29 NOTE — Anesthesia Procedure Notes (Signed)
Epidural Patient location during procedure: OB Start time: 07/29/2018 7:01 PM End time: 07/29/2018 7:10 PM  Staffing Anesthesiologist: Achille Rich, MD Performed: anesthesiologist   Preanesthetic Checklist Completed: patient identified, site marked, pre-op evaluation, timeout performed, IV checked, risks and benefits discussed and monitors and equipment checked  Epidural Patient position: sitting Prep: DuraPrep Patient monitoring: heart rate, cardiac monitor, continuous pulse ox and blood pressure Approach: midline Location: L2-L3 Injection technique: LOR saline  Needle:  Needle type: Tuohy  Needle gauge: 17 G Needle length: 9 cm Needle insertion depth: 5 cm Catheter type: closed end flexible Catheter size: 19 Gauge Catheter at skin depth: 12 cm Test dose: negative and Other  Assessment Events: blood not aspirated, injection not painful, no injection resistance and negative IV test  Additional Notes Informed consent obtained prior to proceeding including risk of failure, 1% risk of PDPH, risk of minor discomfort and bruising.  Discussed rare but serious complications including epidural abscess, permanent nerve injury, epidural hematoma.  Discussed alternatives to epidural analgesia and patient desires to proceed.  Timeout performed pre-procedure verifying patient name, procedure, and platelet count.  Patient tolerated procedure well. Reason for block:procedure for pain

## 2018-07-29 NOTE — MAU Note (Signed)
Patient left MAU sent home after labor check and r/o rupture.  Felt gush of fluid while in lobby and signed back in to be seen.  ROM 1530.

## 2018-07-29 NOTE — MAU Note (Signed)
Having contractions.  irreg yesterday.  Woke up at 0545, now are every 5-+. Lost her plug yesterday, more today.  ? Leaking, changed pantiliner a few times due to wetness, little bit of blood, pinkish when wiped.

## 2018-07-29 NOTE — Anesthesia Preprocedure Evaluation (Signed)
Anesthesia Evaluation  Patient identified by MRN, date of birth, ID band Patient awake    Reviewed: Allergy & Precautions, H&P , NPO status , Patient's Chart, lab work & pertinent test results  Airway Mallampati: II   Neck ROM: full    Dental   Pulmonary former smoker,    breath sounds clear to auscultation       Cardiovascular negative cardio ROS   Rhythm:regular Rate:Normal     Neuro/Psych  Headaches, PSYCHIATRIC DISORDERS Depression    GI/Hepatic   Endo/Other    Renal/GU      Musculoskeletal   Abdominal   Peds  Hematology   Anesthesia Other Findings   Reproductive/Obstetrics (+) Pregnancy                             Anesthesia Physical Anesthesia Plan  ASA: II  Anesthesia Plan: Epidural   Post-op Pain Management:    Induction: Intravenous  PONV Risk Score and Plan: 2 and Treatment may vary due to age or medical condition  Airway Management Planned: Natural Airway  Additional Equipment:   Intra-op Plan:   Post-operative Plan:   Informed Consent: I have reviewed the patients History and Physical, chart, labs and discussed the procedure including the risks, benefits and alternatives for the proposed anesthesia with the patient or authorized representative who has indicated his/her understanding and acceptance.       Plan Discussed with: Anesthesiologist  Anesthesia Plan Comments:         Anesthesia Quick Evaluation

## 2018-07-30 ENCOUNTER — Encounter (HOSPITAL_COMMUNITY): Payer: Self-pay

## 2018-07-30 ENCOUNTER — Ambulatory Visit (HOSPITAL_COMMUNITY): Payer: 59

## 2018-07-30 ENCOUNTER — Ambulatory Visit (HOSPITAL_COMMUNITY)
Admission: RE | Admit: 2018-07-30 | Payer: 59 | Source: Ambulatory Visit | Attending: Family Medicine | Admitting: Family Medicine

## 2018-07-30 DIAGNOSIS — Z3A38 38 weeks gestation of pregnancy: Secondary | ICD-10-CM

## 2018-07-30 DIAGNOSIS — O99824 Streptococcus B carrier state complicating childbirth: Secondary | ICD-10-CM

## 2018-07-30 DIAGNOSIS — O4202 Full-term premature rupture of membranes, onset of labor within 24 hours of rupture: Secondary | ICD-10-CM

## 2018-07-30 LAB — GLUCOSE, CAPILLARY: Glucose-Capillary: 89 mg/dL (ref 70–99)

## 2018-07-30 LAB — ABO/RH: ABO/RH(D): O POS

## 2018-07-30 LAB — RPR: RPR Ser Ql: NONREACTIVE

## 2018-07-30 MED ORDER — OXYCODONE HCL 5 MG PO TABS
5.0000 mg | ORAL_TABLET | ORAL | Status: DC | PRN
  Administered 2018-07-30 – 2018-07-31 (×2): 5 mg via ORAL
  Filled 2018-07-30 (×2): qty 1

## 2018-07-30 MED ORDER — ZOLPIDEM TARTRATE 5 MG PO TABS: 5.0000 mg | ORAL_TABLET | Freq: Every evening | ORAL | Status: DC | PRN

## 2018-07-30 MED ORDER — ACETAMINOPHEN 325 MG PO TABS
650.0000 mg | ORAL_TABLET | ORAL | Status: DC | PRN
  Administered 2018-07-30 – 2018-07-31 (×2): 650 mg via ORAL
  Filled 2018-07-30 (×2): qty 2

## 2018-07-30 MED ORDER — ONDANSETRON HCL 4 MG PO TABS: 4.0000 mg | ORAL_TABLET | ORAL | Status: DC | PRN

## 2018-07-30 MED ORDER — DOCUSATE SODIUM 100 MG PO CAPS
100.0000 mg | ORAL_CAPSULE | Freq: Two times a day (BID) | ORAL | Status: DC
  Administered 2018-07-30 – 2018-07-31 (×2): 100 mg via ORAL
  Filled 2018-07-30 (×2): qty 1

## 2018-07-30 MED ORDER — DIPHENHYDRAMINE HCL 25 MG PO CAPS: 25.0000 mg | ORAL_CAPSULE | Freq: Four times a day (QID) | ORAL | Status: DC | PRN

## 2018-07-30 MED ORDER — SIMETHICONE 80 MG PO CHEW: 80.0000 mg | CHEWABLE_TABLET | ORAL | Status: DC | PRN

## 2018-07-30 MED ORDER — ONDANSETRON HCL 4 MG/2ML IJ SOLN: 4.0000 mg | INTRAMUSCULAR | Status: DC | PRN

## 2018-07-30 MED ORDER — WITCH HAZEL-GLYCERIN EX PADS: 1.0000 "application " | MEDICATED_PAD | CUTANEOUS | Status: DC | PRN

## 2018-07-30 MED ORDER — COCONUT OIL OIL: 1.0000 "application " | TOPICAL_OIL | Status: DC | PRN

## 2018-07-30 MED ORDER — BENZOCAINE-MENTHOL 20-0.5 % EX AERO: 1.0000 "application " | INHALATION_SPRAY | CUTANEOUS | Status: DC | PRN

## 2018-07-30 MED ORDER — TETANUS-DIPHTH-ACELL PERTUSSIS 5-2.5-18.5 LF-MCG/0.5 IM SUSP: 0.5000 mL | Freq: Once | INTRAMUSCULAR | Status: DC

## 2018-07-30 MED ORDER — MEASLES, MUMPS & RUBELLA VAC IJ SOLR: 0.5000 mL | Freq: Once | INTRAMUSCULAR | Status: DC

## 2018-07-30 MED ORDER — LACTATED RINGERS IV SOLN: INTRAVENOUS | Status: DC

## 2018-07-30 MED ORDER — OXYTOCIN 40 UNITS IN NORMAL SALINE INFUSION - SIMPLE MED
1.0000 m[IU]/min | INTRAVENOUS | Status: DC
  Administered 2018-07-30: 2 m[IU]/min via INTRAVENOUS

## 2018-07-30 MED ORDER — IBUPROFEN 600 MG PO TABS
600.0000 mg | ORAL_TABLET | Freq: Four times a day (QID) | ORAL | Status: DC
  Administered 2018-07-30 – 2018-07-31 (×4): 600 mg via ORAL
  Filled 2018-07-30 (×4): qty 1

## 2018-07-30 MED ORDER — PRENATAL MULTIVITAMIN CH
1.0000 | ORAL_TABLET | Freq: Every day | ORAL | Status: DC
  Administered 2018-07-31: 1 via ORAL
  Filled 2018-07-30: qty 1

## 2018-07-30 MED ORDER — TERBUTALINE SULFATE 1 MG/ML IJ SOLN: 0.2500 mg | Freq: Once | INTRAMUSCULAR | Status: DC | PRN

## 2018-07-30 MED ORDER — SENNOSIDES-DOCUSATE SODIUM 8.6-50 MG PO TABS
2.0000 | ORAL_TABLET | ORAL | Status: DC
  Administered 2018-07-31: 2 via ORAL
  Filled 2018-07-30: qty 2

## 2018-07-30 MED ORDER — DIBUCAINE 1 % RE OINT: 1.0000 "application " | TOPICAL_OINTMENT | RECTAL | Status: DC | PRN

## 2018-07-30 NOTE — Discharge Summary (Signed)
Postpartum Discharge Summary     Patient Name: Elizabeth Reese DOB: Mar 07, 1976 MRN: [id number]  Date of admission: 07/29/2018 Delivering Provider: Michaele Offer   Date of discharge: 07/31/2018  Admitting diagnosis: 38wks, water broke Intrauterine pregnancy: [redacted]w[redacted]d     Secondary diagnosis:  Active Problems:   Encounter for induction of labor  Additional problems: None     Discharge diagnosis: Term Pregnancy Delivered                                                                                                Post partum procedures:None  Augmentation: Pitocin  Complications: None  Hospital course:  Onset of Labor With Vaginal Delivery     43 y.o. yo G3P0020 at [redacted]w[redacted]d was admitted in Latent Labor with PROM on 07/29/2018. Patient had an uncomplicated labor course as follows:  Membrane Rupture Time/Date: 3:30 PM ,07/29/2018   Intrapartum Procedures: Episiotomy: None [1]                                         Lacerations:  1st degree [2];Perineal [11]  Patient had a delivery of a Viable infant. 07/30/2018  Information for the patient's newborn:  Nekisha, Whiten [[id number]]  Delivery Method: Vaginal, Spontaneous(Filed from Delivery Summary)    Pateint had an uncomplicated postpartum course.  She is ambulating, tolerating a regular diet, passing flatus, and urinating well. Patient is discharged home in stable condition on 07/31/18.   Magnesium Sulfate recieved: No BMZ received: No  Physical exam  Vitals:   07/30/18 1749 07/30/18 2130 07/31/18 0059 07/31/18 0553  BP: 96/63 106/69 (!) 86/58 100/77  Pulse: 84 85 71 84  Resp: 18 18 16 18   Temp: 98.3 F (36.8 C) 99.1 F (37.3 C) 98.3 F (36.8 C) 98.5 F (36.9 C)  TempSrc: Oral Oral Oral Oral  SpO2:  98% 97% 98%   General: alert and cooperative Lochia: appropriate Uterine Fundus: firm Incision: N/A DVT Evaluation: No evidence of DVT seen on physical exam. Labs: Lab Results  Component Value Date   WBC  8.4 07/29/2018   HGB 12.4 07/29/2018   HCT 37.8 07/29/2018   MCV 91.3 07/29/2018   PLT 318 07/29/2018   CMP Latest Ref Rng & Units 06/28/2015  Glucose 70 - 99 mg/dL 79  BUN 6 - 23 mg/dL 9  Creatinine 9.62 - 8.36 mg/dL 6.29  Sodium 476 - 546 mEq/L 138  Potassium 3.5 - 5.1 mEq/L 3.7  Chloride 96 - 112 mEq/L 104  CO2 19 - 32 mEq/L 28  Calcium 8.4 - 10.5 mg/dL 9.2  Total Protein 6.0 - 8.3 g/dL 6.9  Total Bilirubin 0.2 - 1.2 mg/dL 0.4  Alkaline Phos 39 - 117 U/L 42  AST 0 - 37 U/L 17  ALT 0 - 35 U/L 11    Discharge instruction: per After Visit Summary and "Baby and Me Booklet".  After visit meds:  Allergies as of 07/31/2018      Reactions   Azithromycin Itching, Rash  Medication List    TAKE these medications   aspirin EC 81 MG tablet Take 1 tablet (81 mg total) by mouth daily. Take after 12 weeks for prevention of preeclampsia later in pregnancy   clobetasol cream 0.05 % Commonly known as:  TEMOVATE Apply 1 application topically 2 (two) times daily. Apply to affected area   ibuprofen 800 MG tablet Commonly known as:  ADVIL,MOTRIN Take 1 tablet (800 mg total) by mouth every 8 (eight) hours as needed.   IRON PO Take by mouth.   Misc. Devices Misc 1 Device by Does not apply route daily. 1 breast pump of patients choice.   ondansetron 4 MG disintegrating tablet Commonly known as:  Zofran ODT Take 1 tablet (4 mg total) by mouth every 6 (six) hours as needed for nausea.   Prenatal Vitamins 0.8 MG tablet Take 1 tablet by mouth daily.   senna-docusate 8.6-50 MG tablet Commonly known as:  Senokot-S Take 2 tablets by mouth daily. Start taking on:  August 01, 2018       Diet: routine diet  Activity: Advance as tolerated. Pelvic rest for 6 weeks.   Outpatient follow up:4 weeks Follow up Appt: Follow up Visit: Follow-up Information    [facility]. Schedule an appointment as soon as possible for a visit in 4 week(s).    Specialty:  Obstetrics and Gynecology Why:  Postpartum follow up  Contact information: [address] [place] [address] [phone]           Please schedule this patient for Postpartum visit in: 6 weeks with the following provider: Any provider For C/S patients schedule nurse incision check in weeks 2 weeks: no Low risk pregnancy complicated by: None Delivery mode:  SVD Anticipated Birth Control:  OCPs PP Procedures needed: Routine  Schedule Integrated BH visit: no      Newborn Data: Live born female  Birth Weight: 5 lb 11 oz (2580 g) APGAR: 9, 9  Newborn Delivery   Birth date/time:  07/30/2018 08:36:00 Delivery type:  Vaginal, Spontaneous     Baby Feeding: Breast Disposition:home with mother   07/31/2018 De Hollingshead, DO

## 2018-07-30 NOTE — Anesthesia Postprocedure Evaluation (Signed)
Anesthesia Post Note  Patient: Elizabeth Reese  Procedure(s) Performed: AN AD HOC LABOR EPIDURAL     Patient location during evaluation: Mother Baby Anesthesia Type: Epidural Level of consciousness: awake and oriented Pain management: pain level controlled Vital Signs Assessment: post-procedure vital signs reviewed and stable Respiratory status: spontaneous breathing and respiratory function stable Cardiovascular status: blood pressure returned to baseline Postop Assessment: no headache Anesthetic complications: no    Last Vitals:  Vitals:   07/30/18 0931 07/30/18 1045  BP: 103/69 93/70  Pulse: 93 82  Resp:  18  Temp:  36.9 C  SpO2:      Last Pain:  Vitals:   07/30/18 1045  TempSrc: Oral  PainSc:    Pain Goal:                   Adante Courington

## 2018-07-30 NOTE — Progress Notes (Signed)
LABOR PROGRESS NOTE  Elizabeth Reese is a 43 y.o. G3P0020 at [redacted]w[redacted]d  admitted for PROM.   Subjective: Strip note. Discussed plan of care with RN.   Objective: BP 93/68   Pulse 89   Temp 98.7 F (37.1 C) (Oral)   Resp 18   LMP 10/21/2017 (Exact Date)   SpO2 99%  or  Vitals:   07/30/18 0231 07/30/18 0301 07/30/18 0331 07/30/18 0401  BP: 111/72 99/61 94/66  93/68  Pulse: 87 87 84 89  Resp: 18 18 18 18   Temp:      TempSrc:      SpO2:         Dilation: 5 Effacement (%): 90 Station: 0 Presentation: Vertex Exam by:: Mertha Baars, RN  FHT: baseline rate 125, moderate varibility, +acel, no decel Toco: q2-4 min   Labs: Lab Results  Component Value Date   WBC 8.4 07/29/2018   HGB 12.4 07/29/2018   HCT 37.8 07/29/2018   MCV 91.3 07/29/2018   PLT 318 07/29/2018    Patient Active Problem List   Diagnosis Date Noted  . Encounter for induction of labor 07/29/2018  . Elevated ferritin 01/08/2018  . Supervision of other normal pregnancy, antepartum 12/23/2017  . AMA (advanced maternal age) multigravida 35+, first trimester 12/23/2017  . Baker's cyst 06/08/2015  . Insomnia 06/08/2014  . Family history of breast cancer 02/24/2013  . Preventative health care 10/04/2012  . Depression 09/28/2012    Assessment / Plan: 43 y.o. G3P0020 at [redacted]w[redacted]d here for PROM at 1500.   Labor: Patient unchanged on serial cervical exams. Pitocin started at 0200. Currently on 6 mu/min, continue to titrate as appropriate.  Fetal Wellbeing:  Cat I  Pain Control:  Epidural in place  Anticipated MOD:  NSVD   Marcy Siren, D.O. OB Fellow  07/30/2018, 4:21 AM

## 2018-07-31 ENCOUNTER — Encounter: Payer: Self-pay | Admitting: Family Medicine

## 2018-07-31 MED ORDER — SENNOSIDES-DOCUSATE SODIUM 8.6-50 MG PO TABS: 2.0000 | ORAL_TABLET | ORAL | 0 refills | Status: DC

## 2018-07-31 MED ORDER — IBUPROFEN 800 MG PO TABS: 800.0000 mg | ORAL_TABLET | Freq: Three times a day (TID) | ORAL | 1 refills | Status: DC | PRN

## 2018-07-31 NOTE — Discharge Instructions (Signed)

## 2018-07-31 NOTE — Progress Notes (Signed)
Post Partum Day 1 Subjective: No complaints. Worked with lactation support overnight, starting to supplement. Worried about infant and weight gain, would like to stay through tomorrow.  Objective: Blood pressure 100/77, pulse 84, temperature 98.5 F (36.9 C), temperature source Oral, resp. rate 18, last menstrual period 10/21/2017, SpO2 98 %, unknown if currently breastfeeding.  Physical Exam:  General: alert, well-appearing, NAD Lochia: appropriate Uterine Fundus: firm Incision: n/a DVT Evaluation: No significant calf/ankle edema.  Recent Labs    07/29/18 1611  HGB 12.4  HCT 37.8    Assessment/Plan: Plan for discharge tomorrow  Routine postpartum care, lactation support   LOS: 2 days   Elizabeth Dehaan S Galaxy Borden, DO 07/31/2018, 9:26 AM

## 2018-07-31 NOTE — Lactation Note (Signed)
This note was copied from a baby's chart. Lactation Consultation Note  Patient Name: Elizabeth Reese TJQZE'S Date: 07/31/2018 Reason for consult: Follow-up assessment;Infant < 6lbs;Early term 37-38.6wks Baby is 26 hours old/6% weight loss.  Baby is now being supplemented with neosure and recently took 30 mls.  Mom has DEBP set up but she has not been pumping.  Mom asking about feeding plan.  Instructed to first put baby to breast with feeding cues, post pump x 15 minutes and supplement with expressed milk/formula per guidelines.  She does have a breast pump at home.  Instructed to call for [facility] assist when baby is ready to feed.  Planning for possible discharge today.  Maternal Data    Feeding Feeding Type: Bottle Fed - Formula  LATCH Score                   Interventions    Lactation Tools Discussed/Used     Consult Status Consult Status: Follow-up Date: 07/31/18 Follow-up type: In-patient    Huston Foley 07/31/2018, 10:43 AM

## 2018-07-31 NOTE — Lactation Note (Signed)
This note was copied from a baby's chart. Lactation Consultation Note  Patient Name: Girl Lenah Idol GYKZL'D Date: 07/31/2018 Reason for consult: Initial assessment;1st time breastfeeding P1, 16 hour female infant, SGA, ETI  Per mom, infant was given 12 ml of Similac Neosure 22 kcal prior to [facility] entering the room using slow flow bottle nipple. Mom current feeding choice is breast and bottle feeding. Mom feels she doesn't have breast milk to give infant.  LC assisted mom in hand expression and mom easily expressed 5 ml of colostrum that she will offer for next feeding after putting infant to breast. Mom wanted try latch infant to make sure infant was latching properly due formula feeding prior infant only took few suckles and stopped ( 2 minutes). Mom knows to breastfeed according hunger cues,  8 or more times within 24 hours. Mom knows to use DEBP every 3 hours for 15 minutes on initial setting. Mom shown how to use DEBP & how to disassemble, clean, & reassemble parts. LC discussed I & O. Mom knows to call Nurse or LC if she has any questions, concerns or need assistance with latching infant to breast. When LC left the room mom was doing STS. Mom made aware of O/P services, breastfeeding support groups, community resources, and our phone # for post-discharge questions.  Mom's BF plan: 1. Mom knows to breastfeed according hunger cues, 8 or more times within 24 hours. 2. Mom will hand express or use DEBP and give infant back volume EBM based on infant's age/ hours of life,  if additional supplement is needed she will give Similac Neosure 22 kcal.  3. Mom will continue to do as much STS as possible.  Maternal Data Formula Feeding for Exclusion: No Has patient been taught Hand Expression?: Yes(Mom hand expressed and have 59ml of colostrum for future feeding.) Does the patient have breastfeeding experience prior to this delivery?: No  Feeding Feeding Type: Bottle Fed - Formula Nipple Type:  Slow - flow  LATCH Score Latch: Too sleepy or reluctant, no latch achieved, no sucking elicited.  Audible Swallowing: None  Type of Nipple: Everted at rest and after stimulation  Comfort (Breast/Nipple): Soft / non-tender  Hold (Positioning): Assistance needed to correctly position infant at breast and maintain latch.  LATCH Score: 5  Interventions Interventions: Breast feeding basics reviewed  Lactation Tools Discussed/Used WIC Program: No Pump Review: Setup, frequency, and cleaning;Milk Storage Initiated by:: Danelle Earthly, IBCLC Date initiated:: 07/31/18   Consult Status Consult Status: Follow-up Date: 07/31/18 Follow-up type: In-patient    Danelle Earthly 07/31/2018, 12:41 AM

## 2018-08-02 ENCOUNTER — Inpatient Hospital Stay (HOSPITAL_COMMUNITY): Payer: 59

## 2018-08-03 ENCOUNTER — Inpatient Hospital Stay (HOSPITAL_COMMUNITY): Payer: 59

## 2018-08-03 ENCOUNTER — Other Ambulatory Visit: Payer: Self-pay | Admitting: Obstetrics & Gynecology

## 2018-08-03 ENCOUNTER — Telehealth: Payer: Self-pay

## 2018-08-03 MED ORDER — CEPHALEXIN 500 MG PO CAPS: 500.0000 mg | ORAL_CAPSULE | Freq: Two times a day (BID) | ORAL | 0 refills | Status: AC

## 2018-08-03 NOTE — Telephone Encounter (Signed)
Patient calling and is five days postpartum.  Patient is reporting that when she sneezes or coughs she has a giant gush of urine. Patient states she is having to wear depends underwear because of the lack of control of her bladder.  Patient then had episode where she was just standing and had a big gush of urine.  Patient would like some advice on what she can do to help this. Will route to provider for input. Armandina Stammer RN

## 2018-08-03 NOTE — Progress Notes (Signed)
Pt reports leakage of urine since she delivered. She had a rapid second stage with no instrumentation. She reports a very small laceration. She did have the foley cath in >12 hours. Pt denies fever or chills. She notes blood in her underwear and the toilet but, is not sure if this is from lochia or hematuria   Incontinence post delivery in uncomplicated SVD  Keflex 500mg  bid x 5 days Pt will call in 1 week and report out her sx.   All questions answered.   Clh-S

## 2018-08-27 ENCOUNTER — Ambulatory Visit: Payer: Self-pay | Admitting: Obstetrics & Gynecology

## 2018-09-02 ENCOUNTER — Ambulatory Visit: Payer: Self-pay | Admitting: Obstetrics & Gynecology

## 2018-09-03 ENCOUNTER — Ambulatory Visit: Payer: Self-pay | Admitting: Family Medicine

## 2019-08-09 ENCOUNTER — Encounter: Payer: Self-pay | Admitting: Family

## 2019-08-09 ENCOUNTER — Telehealth (INDEPENDENT_AMBULATORY_CARE_PROVIDER_SITE_OTHER): Payer: Self-pay | Admitting: Family

## 2019-08-09 DIAGNOSIS — F32A Depression, unspecified: Secondary | ICD-10-CM

## 2019-08-09 DIAGNOSIS — F329 Major depressive disorder, single episode, unspecified: Secondary | ICD-10-CM

## 2019-08-09 MED ORDER — SERTRALINE HCL 50 MG PO TABS: ORAL_TABLET | ORAL | 0 refills | Status: DC

## 2019-08-09 NOTE — Progress Notes (Signed)
Virtual Visit via Video Note  I connected with Elizabeth Reese on 08/09/19 at 10:15 AM EDT and verified that I am speaking with the correct person using two identifiers. We attempted to connect via Caregility platform but we were unsuccessful so the visit was transitioned to telephone.   Location: Patient: home Provider: work   I discussed the limitations of evaluation and management by telemedicine and the availability of in person appointments. The patient expressed understanding and agreed to proceed.  History of Present Illness:  Patient presents today to discuss depression.  She states that she gave birth 1 year ago to her first child- a healthy baby girl. She was laid off the day that she gave birth. She has not been successful in securing employment since that time. She lives with her mother and daughter. States that she has been very depressed.  Feels isolated. She is afraid to leave the home or let others in due to COVID.  Reports that she has "explosions" in her mood. Unable to motivate at all.  Notes fleeting thoughts that she can't go on, but states that she would never hurt herself due to feeling the need to be there for her daughter. She has been on zoloft and effexor in the past and notes that zoloft worked well for her. She is interested in restarting medication.    Past Medical History:  Diagnosis Date  . Baker's cyst of knee   . Depression    doing good now  . Migraine      Social History   Socioeconomic History  . Marital status: Single    Spouse name: Not on file  . Number of children: 0  . Years of education: Not on file  . Highest education level: Not on file  Occupational History  . Not on file  Tobacco Use  . Smoking status: Former Smoker    Types: Cigarettes  . Smokeless tobacco: Never Used  . Tobacco comment: quit beginning of preg  Substance and Sexual Activity  . Alcohol use: Not Currently    Alcohol/week: 10.0 standard drinks    Types: 10  Standard drinks or equivalent per week  . Drug use: No  . Sexual activity: Yes    Birth control/protection: None  Other Topics Concern  . Not on file  Social History Narrative   Caffeine use:  No   Regular exercise: no   Works as a Engineer, structural.   Single, no kids   Some college   Lots of family nearby.    One daughter 45   Social Determinants of Health   Financial Resource Strain:   . Difficulty of Paying Living Expenses:   Food Insecurity:   . Worried About Programme researcher, broadcasting/film/video in the Last Year:   . Barista in the Last Year:   Transportation Needs:   . Freight forwarder (Medical):   Marland Kitchen Lack of Transportation (Non-Medical):   Physical Activity:   . Days of Exercise per Week:   . Minutes of Exercise per Session:   Stress:   . Feeling of Stress :   Social Connections:   . Frequency of Communication with Friends and Family:   . Frequency of Social Gatherings with Friends and Family:   . Attends Religious Services:   . Active Member of Clubs or Organizations:   . Attends Banker Meetings:   Marland Kitchen Marital Status:   Intimate Partner Violence:   . Fear of Current  or Ex-Partner:   . Emotionally Abused:   Marland Kitchen Physically Abused:   . Sexually Abused:     Past Surgical History:  Procedure Laterality Date  . DILATION AND CURETTAGE OF UTERUS    . THERAPEUTIC ABORTION      Family History  Problem Relation Age of Onset  . Hypertension Mother   . Cancer Maternal Grandfather        ? liver cancer  . Hypertension Maternal Aunt   . Diabetes Maternal Uncle   . Arthritis Maternal Grandmother   . Hypertension Maternal Grandmother   . Cancer Maternal Grandmother 65       breast    Allergies  Allergen Reactions  . Azithromycin Itching and Rash    No current outpatient medications on file prior to visit.   No current facility-administered medications on file prior to visit.    There were no vitals taken for this visit.      Observations/Objective:   Gen: Awake, alert, no acute distress Resp: Breathing is even and non-labored Psych: calm/pleasant demeanor Neuro: Alert and Oriented x 3, + facial symmetry, speech is clear.   Assessment and Plan:  Depression- uncontrolled.  Will initiate zoloft 50mg . I instructed pt to start 1/2 tablet once daily for 1 week and then increase to a full tablet once daily on week two as tolerated.   Also discussed rare but serious side effect of suicide ideation.  She is instructed to discontinue medication go directly to ED if this occurs.  Pt verbalizes understanding.  Plan follow up in 1 month to evaluate progress.    Follow Up Instructions:    I discussed the assessment and treatment plan with the patient. The patient was provided an opportunity to ask questions and all were answered. The patient agreed with the plan and demonstrated an understanding of the instructions.   The patient was advised to call back or seek an in-person evaluation if the symptoms worsen or if the condition fails to improve as anticipated.  Nance Pear, NP

## 2019-08-16 ENCOUNTER — Other Ambulatory Visit: Payer: Self-pay

## 2019-08-16 ENCOUNTER — Ambulatory Visit (INDEPENDENT_AMBULATORY_CARE_PROVIDER_SITE_OTHER): Payer: Self-pay | Admitting: Family

## 2019-08-16 ENCOUNTER — Telehealth: Payer: Self-pay

## 2019-08-16 VITALS — Wt 166.8 lb

## 2019-08-16 DIAGNOSIS — F418 Other specified anxiety disorders: Secondary | ICD-10-CM

## 2019-08-16 NOTE — Progress Notes (Signed)
Virtual Visit via Telephone Note  I connected with Elizabeth Reese on 08/16/19 at 12:40 PM EDT by telephone and verified that I am speaking with the correct person using two identifiers.  Location: Patient: home Provider: office   I discussed the limitations, risks, security and privacy concerns of performing an evaluation and management service by telephone and the availability of in person appointments. I also discussed with the patient that there may be a patient responsible charge related to this service. The patient expressed understanding and agreed to proceed.   History of Present Illness:  Patient is a 44 year old female who presents today for follow-up.  We last saw her on August 09, 2019.  At that time, she noted feelings of depression and isolation.  She also had a great deal of anxiety especially about leaving her home or letting others into her home due to Covid.  She found that she was having emotional explosions in her mood.  Was having trouble motivating.  We initiated Zoloft 50 mg.  She started half tab once daily which she has been taking now for 1 week.  She reports that her moods and emotions already better.  The only side effect that she has noted is that she feels like the medication keeps her awake.  She also notes poor appetite.  She is pleased with this however, as she is wanting to lose some weight.  She has been taking the medication in the evenings.   Observations/Objective:  Gen: awake, alert Psych: calm/pleasant affect. Neuro: A and O x 3   Assessment and Plan:  Depression/anxiety-this is improving despite her only being on medication for 1 week. I have advised her to increase the Zoloft as planned to 50 mg once daily.  We will plan to have her follow-up in approximately 1 month.  Follow Up Instructions:    I discussed the assessment and treatment plan with the patient. The patient was provided an opportunity to ask questions and all were answered. The  patient agreed with the plan and demonstrated an understanding of the instructions.   The patient was advised to call back or seek an in-person evaluation if the symptoms worsen or if the condition fails to improve as anticipated.  I provided 15 minutes of non-face-to-face time during this encounter.   Lemont Fillers, NP

## 2019-08-17 ENCOUNTER — Encounter: Payer: Self-pay | Admitting: Family

## 2019-09-13 ENCOUNTER — Telehealth (INDEPENDENT_AMBULATORY_CARE_PROVIDER_SITE_OTHER): Payer: Self-pay | Admitting: Family

## 2019-09-13 ENCOUNTER — Encounter: Payer: Self-pay | Admitting: Family

## 2019-09-13 DIAGNOSIS — Z87891 Personal history of nicotine dependence: Secondary | ICD-10-CM

## 2019-09-13 DIAGNOSIS — F32A Depression, unspecified: Secondary | ICD-10-CM

## 2019-09-13 DIAGNOSIS — F329 Major depressive disorder, single episode, unspecified: Secondary | ICD-10-CM

## 2019-09-13 DIAGNOSIS — F419 Anxiety disorder, unspecified: Secondary | ICD-10-CM

## 2019-09-13 MED ORDER — SERTRALINE HCL 50 MG PO TABS: 50.0000 mg | ORAL_TABLET | Freq: Every day | ORAL | 3 refills | Status: DC

## 2019-09-13 NOTE — Progress Notes (Signed)
Virtual Visit via Video Note  I connected with [name] on 09/13/19 at 12:00 PM EDT  verified that I am speaking with the correct person using two identifiers.  She had tech issues and was unable to connect via video. Therefore we switched to a telephone visit.  Location: Patient: home Provider: work   I discussed the limitations of evaluation and management by telemedicine and the availability of in person appointments. The patient expressed understanding and agreed to proceed.  History of Present Illness:  Patient is a 44 yr old female who presents today for follow up of her depression and anxiety. Last visit she was taking zoloft 25mg .  At that time she noted some improvement in her mood but was having some issues with sleep. We increased zoloft to 50mg  once daily. She reports that she definitely has noted an improvement in her mood.   Reports that she is sleeping better, eating better.  No longer lashing out or easily aggravated.   Quit smoking 22 days ago.  Stopped drinking as well. Trying to be as healthy as possible for her child who is now 78 months old.  She has not weighed recently. Thinks she might have gained a bit of weight though.  Wt Readings from Last 3 Encounters:  08/16/19 166 lb 12.8 oz (75.7 kg)  07/29/18 177 lb 12.8 oz (80.6 kg)  07/17/18 177 lb (80.3 kg)      Observations/Objective:   Gen: Awake, alert, no acute distress Resp: Breathing is even and non-labored Psych: calm/pleasant demeanor Neuro: Alert and Oriented x 3,  speech is clear.   Assessment and Plan:  Anxiety/depression-  Stable/improved on zoloft 50mg . Continue current dose. I have asked her to weigh herself and send me her reading via mychart.   History of tobacco abuse- I commended pt on quitting smoking.    15 minutes spent on today's visit.  Follow Up Instructions:    I discussed the assessment and treatment plan with the patient. The patient was provided an opportunity to  ask questions and all were answered. The patient agreed with the plan and demonstrated an understanding of the instructions.   The patient was advised to call back or seek an in-person evaluation if the symptoms worsen or if the condition fails to improve as anticipated.  09/28/18, NP

## 2019-12-21 ENCOUNTER — Other Ambulatory Visit: Payer: Self-pay

## 2019-12-21 ENCOUNTER — Telehealth: Payer: Self-pay | Admitting: Family

## 2019-12-25 ENCOUNTER — Other Ambulatory Visit: Payer: Self-pay | Admitting: Family

## 2019-12-28 ENCOUNTER — Encounter: Payer: Self-pay | Admitting: Family

## 2019-12-28 ENCOUNTER — Telehealth (INDEPENDENT_AMBULATORY_CARE_PROVIDER_SITE_OTHER): Payer: Self-pay | Admitting: Family

## 2019-12-28 DIAGNOSIS — F32A Depression, unspecified: Secondary | ICD-10-CM

## 2019-12-28 DIAGNOSIS — R58 Hemorrhage, not elsewhere classified: Secondary | ICD-10-CM

## 2019-12-28 DIAGNOSIS — F329 Major depressive disorder, single episode, unspecified: Secondary | ICD-10-CM

## 2019-12-28 DIAGNOSIS — F419 Anxiety disorder, unspecified: Secondary | ICD-10-CM

## 2019-12-28 NOTE — Patient Instructions (Signed)
Cut zoloft in half and take 1/2 tab once daily for 1 week, then 1/2 tablet every other day for 1 week then stop. Start cymbalta 30mg  cap once daily for 1 week, then increase to 2 caps daily on week two.

## 2019-12-28 NOTE — Progress Notes (Signed)
Virtual Visit via Video Note  I connected with Elizabeth Reese on 12/28/19 at  3:40 PM EDT by a video enabled telemedicine application and verified that I am speaking with the correct person using two identifiers.  Location: Patient: home Provider: work   I discussed the limitations of evaluation and management by telemedicine and the availability of in person appointments. The patient expressed understanding and agreed to proceed. Only the patient and myself were present for today's video call.   History of Present Illness:  Patient is a 44 yr old female who presents today for follow up.  Reports that she is starting to forget things.  Notes increased bruising.  No aspirin containing medications.   Reports weight is 181 lbs.  Reports that she is staying active.  Cooking at home trying to eat healthy.   Wt Readings from Last 3 Encounters:  08/16/19 166 lb 12.8 oz (75.7 kg)  07/29/18 177 lb 12.8 oz (80.6 kg)  07/17/18 177 lb (80.3 kg)   Mood has been "really great."  Reports that she is able to think things through, less frustrated, less irritated.  Feels like her short term memory is not great since starting zoloft. Definitely feels like she needs to be on something for depression but "I don't think zoloft is for me."      Observations/Objective:   Gen: Awake, alert, no acute distress Resp: Breathing is even and non-labored Psych: calm/pleasant demeanor Neuro: Alert and Oriented x 3, + facial symmetry, speech is clear.   Assessment and Plan:  Anxiety/depression- mood is good on zoloft, however she is bothered by the weight gain.  Advised pt as follows:  Cut zoloft in half and take 1/2 tab once daily for 1 week, then 1/2 tablet every other day for 1 week then stop. Start cymbalta 30mg  cap once daily for 1 week, then increase to 2 caps daily on week two.   Bruising- I have asked her to send me a photo of her bruises.  Will plan to check a cbc when she comes to the office  in 3 weeks for follow up.   Follow Up Instructions:    I discussed the assessment and treatment plan with the patient. The patient was provided an opportunity to ask questions and all were answered. The patient agreed with the plan and demonstrated an understanding of the instructions.   The patient was advised to call back or seek an in-person evaluation if the symptoms worsen or if the condition fails to improve as anticipated.  , NP

## 2019-12-29 ENCOUNTER — Telehealth: Payer: Self-pay | Admitting: Family

## 2019-12-29 MED ORDER — DULOXETINE HCL 30 MG PO CPEP: ORAL_CAPSULE | ORAL | 0 refills | Status: DC

## 2019-12-29 NOTE — Telephone Encounter (Signed)
rx sent. Left message on voicemail that rx has bee sent.

## 2019-12-29 NOTE — Telephone Encounter (Signed)
Patient is calling in reference to her starting a new medication (cymbalta) patient states that the pharmacy does not have it.  Please Advise

## 2020-03-06 ENCOUNTER — Encounter: Payer: Self-pay | Admitting: Family

## 2020-03-06 ENCOUNTER — Telehealth: Payer: Self-pay | Admitting: Family

## 2020-03-06 ENCOUNTER — Other Ambulatory Visit: Payer: Self-pay | Admitting: Family

## 2020-03-06 NOTE — Telephone Encounter (Signed)
New pharmacy - pt is completely out  Call pt when done  Medication: DULoxetine (CYMBALTA) 30 MG capsule [[id number]]      Has the patient contacted their pharmacy?  (If no, request that the patient contact the pharmacy for the refill.) (If yes, when and what did the pharmacy advise?)     Preferred Pharmacy (with phone number or street name): [id number] - [place], [place] - [address] AT [facility] [address]  [address], [place] [address]  Phone:  [phone] Fax:  [phone]  DEA #:       Agent: Please be advised that RX refills may take up to 3 business days. We ask that you follow-up with your pharmacy.

## 2020-03-06 NOTE — Telephone Encounter (Signed)
Patient needs refill, has follow up appointment tomorrow at 2 pm.

## 2020-03-07 ENCOUNTER — Ambulatory Visit: Payer: Self-pay | Admitting: Family

## 2020-03-07 MED ORDER — DULOXETINE HCL 30 MG PO CPEP: 60.0000 mg | ORAL_CAPSULE | Freq: Every day | ORAL | 2 refills | Status: DC

## 2020-03-07 NOTE — Addendum Note (Signed)
Addended by: Sandford Craze on: 03/07/2020 08:14 AM   Modules accepted: Orders

## 2020-03-07 NOTE — Telephone Encounter (Signed)
Late entry- refill phoned in last night. Left message on pt's voicemail notifying her.

## 2020-05-08 ENCOUNTER — Telehealth: Payer: Self-pay | Admitting: Family

## 2020-05-08 NOTE — Telephone Encounter (Signed)
New Pharmacy  Medication: DULoxetine (CYMBALTA) 30 MG capsule [[id number]]       Has the patient contacted their pharmacy?  (If no, request that the patient contact the pharmacy for the refill.) (If yes, when and what did the pharmacy advise?)     Preferred Pharmacy (with phone number or street name):  [facility] [address] - [place], [place] - [address] Phone:  [phone]  Fax:  [phone]          Agent: Please be advised that RX refills may take up to 3 business days. We ask that you follow-up with your pharmacy.

## 2020-05-09 MED ORDER — DULOXETINE HCL 30 MG PO CPEP: 60.0000 mg | ORAL_CAPSULE | Freq: Every day | ORAL | 2 refills | Status: DC

## 2020-05-09 NOTE — Telephone Encounter (Signed)
Medication sent to pharmacy  

## 2020-08-02 ENCOUNTER — Encounter: Payer: Self-pay | Admitting: Family

## 2020-09-12 ENCOUNTER — Ambulatory Visit (INDEPENDENT_AMBULATORY_CARE_PROVIDER_SITE_OTHER): Payer: Medicaid Other | Admitting: Family

## 2020-09-12 ENCOUNTER — Encounter: Payer: Self-pay | Admitting: Family

## 2020-09-12 ENCOUNTER — Other Ambulatory Visit: Payer: Self-pay

## 2020-09-12 ENCOUNTER — Ambulatory Visit: Payer: Medicaid Other

## 2020-09-12 VITALS — BP 133/97 | HR 98 | Temp 98.5°F | Resp 16 | Ht 65.0 in | Wt 183.0 lb

## 2020-09-12 DIAGNOSIS — R635 Abnormal weight gain: Secondary | ICD-10-CM

## 2020-09-12 DIAGNOSIS — R238 Other skin changes: Secondary | ICD-10-CM | POA: Diagnosis not present

## 2020-09-12 DIAGNOSIS — R233 Spontaneous ecchymoses: Secondary | ICD-10-CM

## 2020-09-12 DIAGNOSIS — E663 Overweight: Secondary | ICD-10-CM

## 2020-09-12 DIAGNOSIS — Z0000 Encounter for general adult medical examination without abnormal findings: Secondary | ICD-10-CM

## 2020-09-12 DIAGNOSIS — Z113 Encounter for screening for infections with a predominantly sexual mode of transmission: Secondary | ICD-10-CM

## 2020-09-12 DIAGNOSIS — R030 Elevated blood-pressure reading, without diagnosis of hypertension: Secondary | ICD-10-CM

## 2020-09-12 DIAGNOSIS — F32A Depression, unspecified: Secondary | ICD-10-CM

## 2020-09-12 MED ORDER — TRIAMCINOLONE ACETONIDE 0.1 % EX CREA: 1.0000 "application " | TOPICAL_CREAM | Freq: Two times a day (BID) | CUTANEOUS | 0 refills | Status: DC

## 2020-09-12 NOTE — Assessment & Plan Note (Signed)
Stable on cymbalta 30mg  once daily. Continue same.

## 2020-09-12 NOTE — Assessment & Plan Note (Signed)
Referred for mammogram. Counseled on importance of covid vaccination. Pap will be done by GYN. Discussed diet exercise and weight loss. Labs as ordered.  Provided reassurance about her sleep "moaning."  She has no witnessed apneas and no snoring. Monitor.

## 2020-09-12 NOTE — Progress Notes (Signed)
Subjective:    Patient ID: Elizabeth Reese, female    DOB: 03/30/1976, 45 y.o.   MRN: [id number]  HPI  Patient is a 45 yr old female who presents today for cpx.  Patient presents today for complete physical.  Immunizations:   Diet: Exercise:  She feels like zoloft put weight on.   Wt Readings from Last 3 Encounters:  09/12/20 183 lb (83 kg)  08/16/19 166 lb 12.8 oz (75.7 kg)  07/29/18 177 lb 12.8 oz (80.6 kg)  Colonoscopy: next year Pap Smear: due- will call GYN [place]ogram:  [place]     Reports easy bruising.  Anxiety/Depression- reports that she feels good on 30mg  of cymbalta.   Family members  Review of Systems  Constitutional: Negative for unexpected weight change.  HENT: Negative for hearing loss and rhinorrhea.   Eyes: Negative for visual disturbance.  Respiratory: Negative for cough and shortness of breath.   Cardiovascular: Negative for chest pain and leg swelling.  Genitourinary: Negative for dysuria, frequency and menstrual problem.  Neurological: Negative for headaches.       Occasional sciatic pain  Hematological: Negative for adenopathy.  Psychiatric/Behavioral:       See HPI       Past Medical History:  Diagnosis Date  . Baker's cyst of knee   . Depression    doing good now  . Migraine      Social History   Socioeconomic History  . Marital status: Single    Spouse name: Not on file  . Number of children: 0  . Years of education: Not on file  . Highest education level: Not on file  Occupational History  . Not on file  Tobacco Use  . Smoking status: Former Smoker    Types: Cigarettes    Quit date: 08/25/2019    Years since quitting: 1.0  . Smokeless tobacco: Never Used  . Tobacco comment: quit beginning of preg  Vaping Use  . Vaping Use: Never used  Substance and Sexual Activity  . Alcohol use: Not Currently    Alcohol/week: 10.0 standard drinks    Types: 10 Standard drinks or equivalent per week  . Drug use:  No  . Sexual activity: Yes    Birth control/protection: None  Other Topics Concern  . Not on file  Social History Narrative   Caffeine use:  No   Regular exercise: no   Works as a 08/27/2019.   Single, no kids   Some college   Lots of family nearby.    One daughter 46   Social Determinants of Health   Financial Resource Strain: Not on file  Food Insecurity: Not on file  Transportation Needs: Not on file  Physical Activity: Not on file  Stress: Not on file  Social Connections: Not on file  Intimate Partner Violence: Not on file    Past Surgical History:  Procedure Laterality Date  . DILATION AND CURETTAGE OF UTERUS    . THERAPEUTIC ABORTION      Family History  Problem Relation Age of Onset  . Hypertension Mother   . Cancer Maternal Grandfather        ? liver cancer  . Hypertension Maternal Aunt   . Diabetes Maternal Uncle   . Arthritis Maternal Grandmother   . Hypertension Maternal Grandmother   . Cancer Maternal Grandmother 33       breast    Allergies  Allergen Reactions  . Azithromycin Itching  and Rash    Current Outpatient Medications on File Prior to Visit  Medication Sig Dispense Refill  . DULoxetine (CYMBALTA) 30 MG capsule Take 2 capsules (60 mg total) by mouth daily. 60 capsule 2   No current facility-administered medications on file prior to visit.    BP (!) 133/97 (BP Location: Right Arm, Patient Position: Sitting, Cuff Size: Small)   Pulse 98   Temp 98.5 F (36.9 C) (Oral)   Resp 16   Ht 5\' 5"  (1.651 m)   Wt 183 lb (83 kg)   SpO2 97%   BMI 30.45 kg/m    Objective:   Physical Exam  Physical Exam  Constitutional: She is oriented to person, place, and time. She appears well-developed and well-nourished. No distress.  HENT:  Head: Normocephalic and atraumatic.  Right Ear: Tympanic membrane and ear canal normal.  Left Ear: Tympanic membrane and ear canal normal.  Mouth/Throat: not examined, pt wearing  mask Eyes: Pupils are equal, round, and reactive to light. No scleral icterus.  Neck: Normal range of motion. No thyromegaly present.  Cardiovascular: Normal rate and regular rhythm.   No murmur heard. Pulmonary/Chest: Effort normal and breath sounds normal. No respiratory distress. He has no wheezes. She has no rales. She exhibits no tenderness.  Abdominal: Soft. Bowel sounds are normal. She exhibits no distension and no mass. There is no tenderness. There is no rebound and no guarding.  Musculoskeletal: She exhibits no edema.  Lymphadenopathy:    She has no cervical adenopathy.  Neurological: She is alert and oriented to person, place, and time. She has normal patellar reflexes. She exhibits normal muscle tone. Coordination normal.  Skin: Skin is warm and dry.  Psychiatric: She has a normal mood and affect. Her behavior is normal. Judgment and thought content normal.  Breasts: Examined lying Right: Without masses, retractions, discharge or axillary adenopathy.  Left: Without masses, retractions, discharge or axillary adenopathy.  Pelvic: deferred           Assessment & Plan:         Assessment & Plan:

## 2020-09-12 NOTE — Patient Instructions (Addendum)
Please complete lab work prior to leaving. Work on low sodium diet, exercise, weight loss.

## 2020-09-12 NOTE — Assessment & Plan Note (Signed)
Discussed low sodium diet, exercise, weight loss. Repeat bp in 1 month

## 2020-09-13 LAB — CBC WITH DIFFERENTIAL/PLATELET
Basophils Absolute: 0.1 10*3/uL (ref 0.0–0.1)
Basophils Relative: 1.7 % (ref 0.0–3.0)
Eosinophils Absolute: 0.2 10*3/uL (ref 0.0–0.7)
Eosinophils Relative: 4.9 % (ref 0.0–5.0)
HCT: 36.8 % (ref 36.0–46.0)
Hemoglobin: 12.4 g/dL (ref 12.0–15.0)
Lymphocytes Relative: 38.5 % (ref 12.0–46.0)
Lymphs Abs: 1.5 10*3/uL (ref 0.7–4.0)
MCHC: 33.7 g/dL (ref 30.0–36.0)
MCV: 106.9 fl — ABNORMAL HIGH (ref 78.0–100.0)
Monocytes Absolute: 0.6 10*3/uL (ref 0.1–1.0)
Monocytes Relative: 16.3 % — ABNORMAL HIGH (ref 3.0–12.0)
Neutro Abs: 1.5 10*3/uL (ref 1.4–7.7)
Neutrophils Relative %: 38.6 % — ABNORMAL LOW (ref 43.0–77.0)
Platelets: 218 10*3/uL (ref 150.0–400.0)
RBC: 3.44 Mil/uL — ABNORMAL LOW (ref 3.87–5.11)
RDW: 14.9 % (ref 11.5–15.5)
WBC: 3.9 10*3/uL — ABNORMAL LOW (ref 4.0–10.5)

## 2020-09-13 LAB — COMPREHENSIVE METABOLIC PANEL
ALT: 84 U/L — ABNORMAL HIGH (ref 0–35)
AST: 295 U/L — ABNORMAL HIGH (ref 0–37)
Albumin: 4.7 g/dL (ref 3.5–5.2)
Alkaline Phosphatase: 93 U/L (ref 39–117)
BUN: 8 mg/dL (ref 6–23)
CO2: 24 mEq/L (ref 19–32)
Calcium: 9.6 mg/dL (ref 8.4–10.5)
Chloride: 97 mEq/L (ref 96–112)
Creatinine, Ser: 0.45 mg/dL (ref 0.40–1.20)
GFR: 117.27 mL/min (ref >60.00)
Glucose, Bld: 86 mg/dL (ref 70–99)
Potassium: 3.7 mEq/L (ref 3.5–5.1)
Sodium: 135 mEq/L (ref 135–145)
Total Bilirubin: 0.6 mg/dL (ref 0.2–1.2)
Total Protein: 8.4 g/dL — ABNORMAL HIGH (ref 6.0–8.3)

## 2020-09-13 LAB — TSH: TSH: 1.71 u[IU]/mL (ref 0.35–4.50)

## 2020-09-13 LAB — LIPID PANEL
Cholesterol: 276 mg/dL — ABNORMAL HIGH (ref 0–200)
HDL: 179.1 mg/dL (ref >39.00)
LDL Cholesterol: 78 mg/dL (ref 0–99)
NonHDL: 96.67
Total CHOL/HDL Ratio: 2
Triglycerides: 94 mg/dL (ref 0.0–149.0)
VLDL: 18.8 mg/dL (ref 0.0–40.0)

## 2020-09-13 LAB — HIV ANTIBODY (ROUTINE TESTING W REFLEX): HIV 1&2 Ab, 4th Generation: NONREACTIVE

## 2020-09-14 ENCOUNTER — Telehealth: Payer: Self-pay | Admitting: Family

## 2020-09-14 DIAGNOSIS — R945 Abnormal results of liver function studies: Secondary | ICD-10-CM

## 2020-09-14 DIAGNOSIS — R7989 Other specified abnormal findings of blood chemistry: Secondary | ICD-10-CM

## 2020-09-14 NOTE — Telephone Encounter (Signed)
She will call for lab appointment once she is set up for [place], will like to get done same day

## 2020-09-14 NOTE — Telephone Encounter (Signed)
Please advise pt that her liver tests are elevated.  I would like her to return to do some additional blood work (ordered). Also, I would like her to complete an [place] to evaluate liver/gallbladder.  Cholesterol also very high. Previous cholesterol 160 and now 276.  Please work on low fat/low cholesterol diet, exercise, weight loss.

## 2020-09-14 NOTE — Telephone Encounter (Signed)
Patient advised of abnormal test, abdominal ultra sound and to work on her low fat/ low cholesterol diet. She was also advised to exercise and work on loosing weight.

## 2020-09-19 ENCOUNTER — Other Ambulatory Visit: Payer: Self-pay

## 2020-09-19 ENCOUNTER — Ambulatory Visit (HOSPITAL_BASED_OUTPATIENT_CLINIC_OR_DEPARTMENT_OTHER): Payer: Medicaid Other

## 2020-09-19 ENCOUNTER — Ambulatory Visit (HOSPITAL_BASED_OUTPATIENT_CLINIC_OR_DEPARTMENT_OTHER)
Admission: RE | Admit: 2020-09-19 | Discharge: 2020-09-19 | Disposition: A | Discharge status: Home / Self Care | Payer: Medicaid Other | Source: Ambulatory Visit | Attending: Family | Admitting: Family

## 2020-09-19 ENCOUNTER — Other Ambulatory Visit (INDEPENDENT_AMBULATORY_CARE_PROVIDER_SITE_OTHER): Payer: Medicaid Other

## 2020-09-19 ENCOUNTER — Encounter (HOSPITAL_BASED_OUTPATIENT_CLINIC_OR_DEPARTMENT_OTHER): Payer: Self-pay

## 2020-09-19 DIAGNOSIS — R7989 Other specified abnormal findings of blood chemistry: Secondary | ICD-10-CM

## 2020-09-19 DIAGNOSIS — K8020 Calculus of gallbladder without cholecystitis without obstruction: Secondary | ICD-10-CM | POA: Diagnosis not present

## 2020-09-19 DIAGNOSIS — R945 Abnormal results of liver function studies: Secondary | ICD-10-CM | POA: Insufficient documentation

## 2020-09-19 DIAGNOSIS — Z0000 Encounter for general adult medical examination without abnormal findings: Secondary | ICD-10-CM | POA: Diagnosis present

## 2020-09-19 DIAGNOSIS — K7689 Other specified diseases of liver: Secondary | ICD-10-CM | POA: Diagnosis not present

## 2020-09-19 DIAGNOSIS — Z1231 Encounter for screening mammogram for malignant neoplasm of breast: Secondary | ICD-10-CM | POA: Diagnosis not present

## 2020-09-20 ENCOUNTER — Encounter: Payer: Self-pay | Admitting: Family

## 2020-09-20 ENCOUNTER — Telehealth: Payer: Self-pay | Admitting: Family

## 2020-09-20 DIAGNOSIS — K760 Fatty (change of) liver, not elsewhere classified: Secondary | ICD-10-CM | POA: Insufficient documentation

## 2020-09-20 NOTE — Telephone Encounter (Signed)
Are you able to add LFT's to yesterday's acute hep panel please? Dx elevated LFT's.

## 2020-09-20 NOTE — Telephone Encounter (Signed)
I spoke with Elizabeth Reese at [facility] and they will add a hepatic function panel.

## 2020-09-21 LAB — HEPATIC FUNCTION PANEL
AG Ratio: 1.3 (calc) (ref 1.0–2.5)
ALT: 85 U/L — ABNORMAL HIGH (ref 6–29)
AST: 309 U/L — ABNORMAL HIGH (ref 10–30)
Albumin: 4.6 g/dL (ref 3.6–5.1)
Alkaline phosphatase (APISO): 97 U/L (ref 31–125)
Bilirubin, Direct: 0.1 mg/dL (ref 0.0–0.2)
Globulin: 3.5 g/dL (calc) (ref 1.9–3.7)
Indirect Bilirubin: 0.5 mg/dL (calc) (ref 0.2–1.2)
Total Bilirubin: 0.6 mg/dL (ref 0.2–1.2)
Total Protein: 8.1 g/dL (ref 6.1–8.1)

## 2020-09-21 LAB — HEPATITIS PANEL, ACUTE
Hep A IgM: NONREACTIVE
Hep B C IgM: NONREACTIVE
Hepatitis B Surface Ag: NONREACTIVE
Hepatitis C Ab: NONREACTIVE
SIGNAL TO CUT-OFF: 0.03 (ref <1.00)

## 2020-09-22 ENCOUNTER — Telehealth: Payer: Self-pay | Admitting: Family

## 2020-09-22 NOTE — Telephone Encounter (Signed)
Patient advised of results, referral and provider's advise in case of pain, nausea or vomiting. She verbalized understanding.

## 2020-09-22 NOTE — Telephone Encounter (Signed)
Please advise pt that I reviewed her follow up liver tests and they are still elevated.  I would like to refer her to gastroenterology for further evaluation. In the meantime, please let me know if she develops abdominal pain, nausea/vomitting.

## 2020-10-20 ENCOUNTER — Other Ambulatory Visit: Payer: Self-pay

## 2020-10-20 ENCOUNTER — Telehealth: Payer: Self-pay | Admitting: Family

## 2020-10-20 ENCOUNTER — Telehealth (INDEPENDENT_AMBULATORY_CARE_PROVIDER_SITE_OTHER): Payer: Medicaid Other | Admitting: Family

## 2020-10-20 DIAGNOSIS — R7989 Other specified abnormal findings of blood chemistry: Secondary | ICD-10-CM

## 2020-10-20 DIAGNOSIS — R945 Abnormal results of liver function studies: Secondary | ICD-10-CM | POA: Diagnosis not present

## 2020-10-20 DIAGNOSIS — F32A Depression, unspecified: Secondary | ICD-10-CM | POA: Diagnosis not present

## 2020-10-20 MED ORDER — VENLAFAXINE HCL ER 37.5 MG PO CP24: ORAL_CAPSULE | ORAL | 0 refills | Status: DC

## 2020-10-20 NOTE — Progress Notes (Signed)
MyChart Video Visit    Virtual Visit via Video Note   This visit type was conducted due to national recommendations for restrictions regarding the COVID-19 Pandemic (e.g. social distancing) in an effort to limit this patient's exposure and mitigate transmission in our community. This patient is at least at moderate risk for complications without adequate follow up. This format is felt to be most appropriate for this patient at this time. Physical exam was limited by quality of the video and audio technology used for the visit. CMA was able to get the patient set up on a video visit.  Patient location: Home. Patient and provider in visit Provider location: Office  I discussed the limitations of evaluation and management by telemedicine and the availability of in person appointments. The patient expressed understanding and agreed to proceed.  Visit Date: 10/20/2020  Today's healthcare provider: Lemont Fillers, NP     Subjective:    Patient ID: Elizabeth Reese, female    DOB: Jan 27, 1976, 45 y.o.   MRN: [id number]  Chief Complaint  Patient presents with   Depression    "Having side effects from cymbalta, sleep talking"    HPI  Patient is a 45 yr old female who presents today to discuss side effects from Cymbalta.  Her mother reports that she has been talking/moaning loudly in her sleep at night and sometimes sleep walking. She reports that this did not start until after she began cymbalta. She is pleased with her mood on cymbalta but wishes to change medication due to above side effects.   Past Medical History:  Diagnosis Date   Baker's cyst of knee    Depression    doing good now   Fatty liver    Migraine     Past Surgical History:  Procedure Laterality Date   DILATION AND CURETTAGE OF UTERUS     THERAPEUTIC ABORTION      Family History  Problem Relation Age of Onset   Hypertension Mother    Cancer Maternal Grandfather        ? liver cancer   Hypertension  Maternal Aunt    Breast cancer Maternal Aunt    Diabetes Maternal Uncle    Arthritis Maternal Grandmother    Hypertension Maternal Grandmother    Cancer Maternal Grandmother 59       breast    Social History   Socioeconomic History   Marital status: Single    Spouse name: Not on file   Number of children: 0   Years of education: Not on file   Highest education level: Not on file  Occupational History   Not on file  Tobacco Use   Smoking status: Former    Pack years: 0.00    Types: Cigarettes    Quit date: 08/25/2019    Years since quitting: 1.1   Smokeless tobacco: Never   Tobacco comments:    quit beginning of preg  Vaping Use   Vaping Use: Never used  Substance and Sexual Activity   Alcohol use: Not Currently    Alcohol/week: 10.0 standard drinks    Types: 10 Standard drinks or equivalent per week   Drug use: No   Sexual activity: Yes    Birth control/protection: None  Other Topics Concern   Not on file  Social History Narrative   Caffeine use:  No   Regular exercise: no   Works as a Librarian, academic for [employer].   Single, no kids   Some college  Lots of family nearby.    One daughter 18   Social Determinants of Health   Financial Resource Strain: Not on file  Food Insecurity: Not on file  Transportation Needs: Not on file  Physical Activity: Not on file  Stress: Not on file  Social Connections: Not on file  Intimate Partner Violence: Not on file    Outpatient Medications Prior to Visit  Medication Sig Dispense Refill   triamcinolone cream (KENALOG) 0.1 % Apply 1 application topically 2 (two) times daily. 30 g 0   DULoxetine (CYMBALTA) 30 MG capsule Take 2 capsules (60 mg total) by mouth daily. 60 capsule 2   No facility-administered medications prior to visit.    Allergies  Allergen Reactions   Azithromycin Itching and Rash    ROS    See HPI Objective:    Physical Exam Constitutional:      Appearance: Normal appearance.   Neurological:     Mental Status: She is alert and oriented to person, place, and time.  Psychiatric:        Attention and Perception: Attention and perception normal.        Mood and Affect: Mood and affect normal.        Speech: Speech normal.        Behavior: Behavior normal.    There were no vitals taken for this visit. Wt Readings from Last 3 Encounters:  09/12/20 183 lb (83 kg)  08/16/19 166 lb 12.8 oz (75.7 kg)  07/29/18 177 lb 12.8 oz (80.6 kg)       Assessment & Plan:   Problem List Items Addressed This Visit       Unprioritized   Depression - Primary    Stable but having side effects from cymbalta. Will d/c cymbalta and begin Effexor 37.5mg  XR once daily for 3 days then increase to 2 tabs on day 4.        Relevant Medications   venlafaxine XR (EFFEXOR XR) 37.5 MG 24 hr capsule   Abnormal LFTs    LFT's have been quite high x 2.  Acute hep panel is negative. US showed fatty liver, but I would not expect LFT's to be that high due just to fatty liver. Will refer to GI for further evaluation.        Relevant Orders   Ambulatory referral to Gastroenterology    I have discontinued Aliece A. Vanmeter's DULoxetine. I am also having her start on venlafaxine XR. Additionally, I am having her maintain her triamcinolone cream.  Meds ordered this encounter  Medications   venlafaxine XR (EFFEXOR XR) 37.5 MG 24 hr capsule    Sig: Take 1 tablet by mouth once daily for 3 days, then increase to 2 tabs once daily on week 4    Dispense:  60 capsule    Refill:  0    Order Specific Question:   Supervising Provider    Answer:   Danise Edge A [[id number]]    I discussed the assessment and treatment plan with the patient. The patient was provided an opportunity to ask questions and all were answered. The patient agreed with the plan and demonstrated an understanding of the instructions.   The patient was advised to call back or seek an in-person evaluation if the symptoms worsen or  if the condition fails to improve as anticipated.  Lemont Fillers, NP [employer] [facility] [employer] [phone] (phone) [phone] (fax)  [facility]

## 2020-10-20 NOTE — Telephone Encounter (Signed)
Called pharmacy with correction.

## 2020-10-20 NOTE — Telephone Encounter (Signed)
Need medication clarification venlafaxine XR (EFFEXOR XR) 37.5 MG 24 hr capsule [[id number]]

## 2020-10-20 NOTE — Assessment & Plan Note (Signed)
Stable but having side effects from cymbalta. Will d/c cymbalta and begin Effexor 37.5mg  XR once daily for 3 days then increase to 2 tabs on day 4.

## 2020-10-20 NOTE — Patient Instructions (Signed)
Please stop cymbalta and begin effexor.

## 2020-10-20 NOTE — Assessment & Plan Note (Signed)
LFT's have been quite high x 2.  Acute hep panel is negative. US showed fatty liver, but I would not expect LFT's to be that high due just to fatty liver. Will refer to GI for further evaluation.

## 2020-11-14 ENCOUNTER — Other Ambulatory Visit: Payer: Self-pay

## 2020-11-14 ENCOUNTER — Ambulatory Visit (INDEPENDENT_AMBULATORY_CARE_PROVIDER_SITE_OTHER): Payer: Medicaid Other | Admitting: Family

## 2020-11-14 VITALS — BP 139/90 | HR 96 | Resp 18 | Ht 65.0 in | Wt 178.8 lb

## 2020-11-14 DIAGNOSIS — F32A Depression, unspecified: Secondary | ICD-10-CM | POA: Diagnosis not present

## 2020-11-14 DIAGNOSIS — M5431 Sciatica, right side: Secondary | ICD-10-CM | POA: Diagnosis not present

## 2020-11-14 DIAGNOSIS — R945 Abnormal results of liver function studies: Secondary | ICD-10-CM | POA: Diagnosis not present

## 2020-11-14 DIAGNOSIS — R7989 Other specified abnormal findings of blood chemistry: Secondary | ICD-10-CM

## 2020-11-14 MED ORDER — VENLAFAXINE HCL ER 37.5 MG PO CP24: ORAL_CAPSULE | ORAL | 0 refills | Status: DC

## 2020-11-14 MED ORDER — BUPROPION HCL ER (XL) 150 MG PO TB24: 150.0000 mg | ORAL_TABLET | Freq: Every day | ORAL | 1 refills | Status: DC

## 2020-11-14 NOTE — Assessment & Plan Note (Signed)
Stable, but due to sleep walking-we discussed that the sleepwalking may be secondary to the antidepressant, however it is also possible that it is unrelated.  She does not wish to take an SSRI due to the potential for weight gain.  She has managed to take a few pounds off since her last visit.  I advised the patient as follows:  Change effexor to every other day for 2 weeks then stop. Start Wellbutrin xl 150mg  once daily.

## 2020-11-14 NOTE — Patient Instructions (Addendum)
Change effexor to every other day for 2 weeks then stop. Start Wellbutrin xl 150mg  once daily.

## 2020-11-14 NOTE — Assessment & Plan Note (Signed)
She she asked if she can use Aleve as needed.  Advised patient okay to use Aleve sparingly.  Call if symptoms worsen or fail to improve.

## 2020-11-14 NOTE — Assessment & Plan Note (Signed)
Advised patient to keep her upcoming appointment with GI.  She did recently have an abdominal ultrasound which did not show any abnormalities in the left upper abdomen.  Her exam is normal.  Perhaps her symptoms are musculoskeletal in nature.  We will continue to monitor.

## 2020-11-14 NOTE — Progress Notes (Signed)
Subjective:     Patient ID: Elizabeth Reese, female    DOB: 1976/04/07, 45 y.o.   MRN: [id number]  Chief Complaint  Patient presents with   Depression    HPI Patient is in today for follow-up of her depression.  She was last seen on June 24 for virtual visit.  At that time, she noted that she had been talking and moaning loudly in her sleep according to her mother.  She also had some occasional sleepwalking.  She attributed these symptoms to side effects of Cymbalta.  Though she was pleased with her mood on the Cymbalta she wished to change medication due to the side effects.  Last visit we discontinued Cymbalta and begin Effexor 37.5 mg XR once daily for 3 days then increase to 2 tabs once daily.   Wt Readings from Last 3 Encounters:  11/14/20 178 lb 12.8 oz (81.1 kg)  09/12/20 183 lb (83 kg)  08/16/19 166 lb 12.8 oz (75.7 kg)   She reports that since we made this change she has had a stable mood, but has continued to have sleep walking.   Abnormal LFTs-last visit we also placed a referral to gastroenterology due to several episodes of increased LFTs.  She is currently scheduled to see gastroenterology on 11/20/2020.  BP Readings from Last 3 Encounters:  11/14/20 139/90  09/12/20 (!) 133/97  07/31/18 100/77   She notes occasional right sided sciatica and occasional "knot" in the left upper abdomen.     Health Maintenance Due  Topic Date Due   COVID-19 Vaccine (1) Never done   PAP SMEAR-Modifier  12/23/2020    Past Medical History:  Diagnosis Date   Baker's cyst of knee    Depression    doing good now   Fatty liver    Migraine     Past Surgical History:  Procedure Laterality Date   DILATION AND CURETTAGE OF UTERUS     THERAPEUTIC ABORTION      Family History  Problem Relation Age of Onset   Hypertension Mother    Cancer Maternal Grandfather        ? liver cancer   Hypertension Maternal Aunt    Breast cancer Maternal Aunt    Diabetes Maternal Uncle     Arthritis Maternal Grandmother    Hypertension Maternal Grandmother    Cancer Maternal Grandmother 7       breast    Social History   Socioeconomic History   Marital status: Single    Spouse name: Not on file   Number of children: 0   Years of education: Not on file   Highest education level: Not on file  Occupational History   Not on file  Tobacco Use   Smoking status: Former    Types: Cigarettes    Quit date: 08/25/2019    Years since quitting: 1.2   Smokeless tobacco: Never   Tobacco comments:    quit beginning of preg  Vaping Use   Vaping Use: Never used  Substance and Sexual Activity   Alcohol use: Not Currently    Alcohol/week: 10.0 standard drinks    Types: 10 Standard drinks or equivalent per week   Drug use: No   Sexual activity: Yes    Birth control/protection: None  Other Topics Concern   Not on file  Social History Narrative   Caffeine use:  No   Regular exercise: no   Works as a Librarian, academic for [employer].   Single, no kids  Some college   Lots of family nearby.    One daughter 33   Social Determinants of Health   Financial Resource Strain: Not on file  Food Insecurity: Not on file  Transportation Needs: Not on file  Physical Activity: Not on file  Stress: Not on file  Social Connections: Not on file  Intimate Partner Violence: Not on file    Outpatient Medications Prior to Visit  Medication Sig Dispense Refill   triamcinolone cream (KENALOG) 0.1 % Apply 1 application topically 2 (two) times daily. 30 g 0   venlafaxine XR (EFFEXOR XR) 37.5 MG 24 hr capsule Take 1 tablet by mouth once daily for 3 days, then increase to 2 tabs once daily on week 4 60 capsule 0   No facility-administered medications prior to visit.    Allergies  Allergen Reactions   Azithromycin Itching and Rash    ROS See HPI     Objective:    Physical Exam Constitutional:      General: She is not in acute distress.    Appearance: Normal appearance. She  is well-developed.  HENT:     Head: Normocephalic and atraumatic.     Right Ear: External ear normal.     Left Ear: External ear normal.  Eyes:     General: No scleral icterus. Neck:     Thyroid: No thyromegaly.  Cardiovascular:     Rate and Rhythm: Normal rate and regular rhythm.     Heart sounds: Normal heart sounds. No murmur heard. Pulmonary:     Effort: Pulmonary effort is normal. No respiratory distress.     Breath sounds: Normal breath sounds. No wheezing.  Abdominal:     General: Bowel sounds are normal.     Palpations: Abdomen is soft. There is no mass.     Tenderness: There is no abdominal tenderness. There is no guarding or rebound.     Hernia: No hernia is present.  Musculoskeletal:     Cervical back: Neck supple.  Skin:    General: Skin is warm and dry.  Neurological:     Mental Status: She is alert and oriented to person, place, and time.  Psychiatric:        Mood and Affect: Mood normal.        Behavior: Behavior normal.        Thought Content: Thought content normal.        Judgment: Judgment normal.    BP 139/90   Pulse 96   Resp 18   Ht 5\' 5"  (1.651 m)   Wt 178 lb 12.8 oz (81.1 kg)   SpO2 100%   BMI 29.75 kg/m  Wt Readings from Last 3 Encounters:  11/14/20 178 lb 12.8 oz (81.1 kg)  09/12/20 183 lb (83 kg)  08/16/19 166 lb 12.8 oz (75.7 kg)       Assessment & Plan:   Problem List Items Addressed This Visit       Unprioritized   Sciatica, right side - Primary    She she asked if she can use Aleve as needed.  Advised patient okay to use Aleve sparingly.  Call if symptoms worsen or fail to improve.       Relevant Medications   venlafaxine XR (EFFEXOR XR) 37.5 MG 24 hr capsule   buPROPion (WELLBUTRIN XL) 150 MG 24 hr tablet   Depression    Stable, but due to sleep walking-we discussed that the sleepwalking may be secondary to the antidepressant, however it is  also possible that it is unrelated.  She does not wish to take an SSRI due to the  potential for weight gain.  She has managed to take a few pounds off since her last visit.  I advised the patient as follows:  Change effexor to every other day for 2 weeks then stop. Start Wellbutrin xl 150mg  once daily.        Relevant Medications   venlafaxine XR (EFFEXOR XR) 37.5 MG 24 hr capsule   buPROPion (WELLBUTRIN XL) 150 MG 24 hr tablet   Abnormal LFTs    Advised patient to keep her upcoming appointment with GI.  She did recently have an abdominal ultrasound which did not show any abnormalities in the left upper abdomen.  Her exam is normal.  Perhaps her symptoms are musculoskeletal in nature.  We will continue to monitor.        I have changed Vianne A. Bolls's venlafaxine XR. I am also having her start on buPROPion. Additionally, I am having her maintain her triamcinolone cream.  Meds ordered this encounter  Medications   venlafaxine XR (EFFEXOR XR) 37.5 MG 24 hr capsule    Sig: Take 1 tab by mouth every other day for 2 weeks then stop.    Dispense:  30 capsule    Refill:  0    Order Specific Question:   Supervising Provider    Answer:   A [[id number]]   buPROPion (WELLBUTRIN XL) 150 MG 24 hr tablet    Sig: Take 1 tablet (150 mg total) by mouth daily.    Dispense:  30 tablet    Refill:  1    Order Specific Question:   Supervising Provider    Answer:   Danise Edge A [[id number]]

## 2020-11-20 ENCOUNTER — Encounter: Payer: Self-pay | Admitting: Gastroenterology

## 2020-11-20 ENCOUNTER — Ambulatory Visit (INDEPENDENT_AMBULATORY_CARE_PROVIDER_SITE_OTHER): Payer: Medicaid Other | Admitting: Gastroenterology

## 2020-11-20 ENCOUNTER — Other Ambulatory Visit: Payer: Self-pay

## 2020-11-20 ENCOUNTER — Other Ambulatory Visit (INDEPENDENT_AMBULATORY_CARE_PROVIDER_SITE_OTHER): Payer: Medicaid Other

## 2020-11-20 VITALS — BP 102/78 | HR 88 | Ht 65.0 in | Wt 182.4 lb

## 2020-11-20 DIAGNOSIS — D7589 Other specified diseases of blood and blood-forming organs: Secondary | ICD-10-CM

## 2020-11-20 DIAGNOSIS — E7889 Other lipoprotein metabolism disorders: Secondary | ICD-10-CM

## 2020-11-20 DIAGNOSIS — R7401 Elevation of levels of liver transaminase levels: Secondary | ICD-10-CM | POA: Diagnosis not present

## 2020-11-20 DIAGNOSIS — E8889 Other specified metabolic disorders: Secondary | ICD-10-CM

## 2020-11-20 NOTE — Patient Instructions (Addendum)
If you are age 44 or older, your body mass index should be between 23-30. Your Body mass index is 30.35 kg/m. If this is out of the aforementioned range listed, please consider follow up with your Primary Care Provider.  If you are age 97 or younger, your body mass index should be between 19-25. Your Body mass index is 30.35 kg/m. If this is out of the aformentioned range listed, please consider follow up with your Primary Care Provider.   __________________________________________________________  The [facility] providers would like to encourage you to use [facility] to communicate with providers for non-urgent requests or questions.  Due to long hold times on the telephone, sending your provider a message by [facility] may be a faster and more efficient way to get a response.  Please allow 48 business hours for a response.  Please remember that this is for non-urgent requests.   __________________________________________________________ Please go to the lab on the 2nd floor [address] before you leave the office today.   ___________________________________________________________ Elizabeth Reese are scheduled to follow up in the clinic on 01/23/2021 at 3:20pm  Due to recent changes in healthcare laws, you may see the results of your imaging and laboratory studies on MyChart before your provider has had a chance to review them.  We understand that in some cases there may be results that are confusing or concerning to you. Not all laboratory results come back in the same time frame and the provider may be waiting for multiple results in order to interpret others.  Please give [place] 48 hours in order for your provider to thoroughly review all the results before contacting the office for clarification of your results.   Thank you for choosing me and [facility].  Vito Cirigliano, D.O.

## 2020-11-20 NOTE — Progress Notes (Signed)
Chief Complaint: Elevated liver enzymes, abnormal abdominal ultrasound  Referring Provider:    Sandford Craze, NP   HPI:     Elizabeth Reese is a 45 y.o. female with a history of depression, migraines, referred to the Gastroenterology Clinic for evaluation of elevated liver enzymes.  Recent routine labs notable for elevated AST/ALT with otherwise normal ALP, T bili, albumin.  AST/ALT trend as below:  - 06/28/2015: 17/11 - 09/12/2020: 295/84 - 09/19/2020 309/85  - Negative acute viral hepatitis panel, TSH, HIV - 09/19/2020: Abdominal US: Minimal cholelithiasis without GB wall thickening, CBD 5 mm, increased hepatic echogenicity  Labs in 08/2020 also with new macrocytosis (MCV 107) with otherwise normal H/H and PLT.  Macrocytosis is new compared to previous.  Drinks 3-4 glasses/wine per day/night, which has steadily increased since having a baby 2 years ago.  Prior to that pregnancy, was only occasional social EtOH use.  Additionally, has gained 30# over the last year. Has had changes in depression meds over the last year or so, including Zoloft --> Cymbalta --> Effexor --> Wellbutrin earlier this month. Tolerating Wellbutrin well.  Otherwise no preceding changes in medications, supplements, OTCs, etc.  She is otherwise without any complaints today.  Family history notable for maternal grandmother (age 37) and maternal grandfather (unknown age) with colon cancer.   Past Medical History:  Diagnosis Date   Anxiety    Baker's cyst of knee    Depression    doing good now   Fatty liver    Migraine      Past Surgical History:  Procedure Laterality Date   DILATION AND CURETTAGE OF UTERUS     THERAPEUTIC ABORTION     Family History  Problem Relation Age of Onset   Hypertension Mother    Arthritis Maternal Grandmother    Hypertension Maternal Grandmother    Cancer Maternal Grandmother 67       breast   Colon cancer Maternal Grandfather    Hypertension  Maternal Aunt    Breast cancer Maternal Aunt    Diabetes Maternal Uncle    Pancreatic cancer Neg Hx    Stomach cancer Neg Hx    Throat cancer Neg Hx    Liver disease Neg Hx    Social History   Tobacco Use   Smoking status: Former    Types: Cigarettes    Quit date: 08/25/2019    Years since quitting: 1.2   Smokeless tobacco: Never   Tobacco comments:    quit beginning of preg  Vaping Use   Vaping Use: Never used  Substance Use Topics   Alcohol use: Yes    Alcohol/week: 10.0 standard drinks    Types: 10 Standard drinks or equivalent per week    Comment: drink 2 glasses of wine daily   Drug use: No   Current Outpatient Medications  Medication Sig Dispense Refill   buPROPion (WELLBUTRIN XL) 150 MG 24 hr tablet Take 1 tablet (150 mg total) by mouth daily. 30 tablet 1   venlafaxine XR (EFFEXOR XR) 37.5 MG 24 hr capsule Take 1 tab by mouth every other day for 2 weeks then stop. 30 capsule 0   No current facility-administered medications for this visit.   Allergies  Allergen Reactions   Azithromycin Itching and Rash     Review of Systems: All systems reviewed and negative except where noted in HPI.     Physical Exam:    Wt  Readings from Last 3 Encounters:  11/20/20 182 lb 6 oz (82.7 kg)  11/14/20 178 lb 12.8 oz (81.1 kg)  09/12/20 183 lb (83 kg)    BP 102/78   Pulse 88   Ht 5\' 5"  (1.651 m)   Wt 182 lb 6 oz (82.7 kg)   SpO2 98%   BMI 30.35 kg/m  Constitutional:  Pleasant, in no acute distress. Psychiatric: Normal mood and affect. Behavior is normal. Neurological: Alert and oriented to person place and time. Skin: Skin is warm and dry. No rashes noted.   ASSESSMENT AND PLAN;   1) Elevated AST > ALT 2) Increased hepatic echogenicity/steatosis on ultrasound 3) Alcohol use disorder  Elevated AST> ALT in the setting of increasing EtOH consumption over the last 2 years.  Endorses 3-4 glasses of wine/day on most days which is well above her baseline prior to  pregnancy at age 71.    - Repeat liver enzymes to establish trend from May - Check INR.  Otherwise normal albumin, creatinine, PLT on labs from May and no e/o portal hypertension on ultrasound - Strongly counseled the patient on complete cessation of all alcohol.  Discussed means of quitting, and thankfully she has an appointment next week with her therapist and can discuss.  If needed, happy to get her set up with local EtOH cessation programs - Pending labs as above, and if able to quit drinking completely, plan for repeat liver enzymes in another 3 months.  If labs still elevated despite EtOH cessation, plan for extended serologic work-up  4) Macrocytosis - Check B12, folate    June, DO, FACG  11/20/2020, 2:45 PM   11/22/2020, NP

## 2020-11-21 LAB — HEPATIC FUNCTION PANEL
ALT: 50 U/L — ABNORMAL HIGH (ref 0–35)
AST: 132 U/L — ABNORMAL HIGH (ref 0–37)
Albumin: 4.3 g/dL (ref 3.5–5.2)
Alkaline Phosphatase: 96 U/L (ref 39–117)
Bilirubin, Direct: 0.1 mg/dL (ref 0.0–0.3)
Total Bilirubin: 0.6 mg/dL (ref 0.2–1.2)
Total Protein: 7.6 g/dL (ref 6.0–8.3)

## 2020-11-21 LAB — VITAMIN B12: Vitamin B-12: 235 pg/mL (ref 211–911)

## 2020-11-21 LAB — PROTIME-INR
INR: 1 ratio (ref 0.8–1.0)
Prothrombin Time: 11.6 s (ref 9.6–13.1)

## 2020-11-21 LAB — FOLATE: Folate: 2.9 ng/mL — ABNORMAL LOW (ref >5.9)

## 2020-11-28 ENCOUNTER — Telehealth: Payer: Self-pay

## 2020-11-28 DIAGNOSIS — R7401 Elevation of levels of liver transaminase levels: Secondary | ICD-10-CM

## 2020-11-28 DIAGNOSIS — Z20822 Contact with and (suspected) exposure to covid-19: Secondary | ICD-10-CM | POA: Diagnosis not present

## 2020-11-28 DIAGNOSIS — D7589 Other specified diseases of blood and blood-forming organs: Secondary | ICD-10-CM

## 2020-11-28 DIAGNOSIS — E538 Deficiency of other specified B group vitamins: Secondary | ICD-10-CM

## 2020-11-28 DIAGNOSIS — K760 Fatty (change of) liver, not elsewhere classified: Secondary | ICD-10-CM

## 2020-11-28 MED ORDER — VITAMIN B-12 1000 MCG PO TABS: 1000.0000 ug | ORAL_TABLET | Freq: Every day | ORAL | 12 refills | Status: DC

## 2020-11-28 MED ORDER — FOLIC ACID 1 MG PO TABS: 1.0000 mg | ORAL_TABLET | Freq: Every day | ORAL | 12 refills | Status: DC

## 2020-11-28 NOTE — Telephone Encounter (Signed)
-----   Message from [facility] V, DO sent at 11/28/2020  1:03 PM EDT ----- Labs demonstrate downtrending liver enzymes, with AST/ALT 132/50.  Otherwise normal bilirubin, albumin.  Normal INR.  Folate and B12 both deficient.  Recommend the following:  - B12 1000 mcg PO daily - Folic acid 1 mg daily - Repeat CBC, Vitamin B12, and folate 3 months after starting therapy to monitor for effect - Repeat liver enzymes in 3 months - Depending on response to vitamin supplementation, will consider alternate sources for B12 deficiency, to include EGD to r/o Pernicious Anemia

## 2020-11-28 NOTE — Telephone Encounter (Signed)
Patient made aware. Medications sent to the pharmacy and I told her that if was expensive she could get them over the counter and labs for [facility] have been placed for 3 months and reminder sent to myself and tracy

## 2020-11-30 ENCOUNTER — Other Ambulatory Visit: Payer: Self-pay

## 2020-11-30 ENCOUNTER — Ambulatory Visit (INDEPENDENT_AMBULATORY_CARE_PROVIDER_SITE_OTHER): Payer: Medicaid Other | Admitting: Clinical

## 2020-11-30 DIAGNOSIS — F331 Major depressive disorder, recurrent, moderate: Secondary | ICD-10-CM | POA: Diagnosis not present

## 2020-12-01 ENCOUNTER — Telehealth: Payer: Self-pay

## 2020-12-01 NOTE — Telephone Encounter (Signed)
Per patient she is taking Wellbutrin 50 with effexor 37.5 (together) every other day. She reports "this is working great for her and will like to continue to take this way.  Please advise

## 2020-12-01 NOTE — Telephone Encounter (Signed)
Pt called in states that she is currently taking wellbutrin and effexor every other day. Pt states that they are trying to wean her off the effexor. Pt states that she would like to continue taking both every other day if that is ok.

## 2020-12-01 NOTE — Telephone Encounter (Signed)
Both medications only work for 24 hrs at a time.  I know she wanted to try to come off of the effexor. I would recommend that she stop the effexor and take the wellbutrin every day.  Let me know how she does with this change.

## 2020-12-01 NOTE — Telephone Encounter (Signed)
Patient advised to still, discontinue effexor as planned and start daily wellbutrin. She verbalized understanding

## 2020-12-02 NOTE — Progress Notes (Signed)
Comprehensive Clinical Assessment (CCA) Note  11/30/2020 SONNI BARSE [id number]  Chief Complaint:  Chief Complaint  Patient presents with   Depression   Visit Diagnosis:  Major depressive disorder, recurrent episode, moderate with anxious distress   Interpretive summary: Client is a 45 year old female presenting to the [facility] for outpatient services.  Client is referred by another client health professional for clinical assessment.  Client presents with a history of depression and anxiety since she was 45 years old.  Client reported in her teens and early 72s she had suicidal ideations and attempted suicide twice.  Client reported during COVID she was laid off from her job as well as gave birth to a girl.  Client reported within the past few months she found out her daughter's father was not present. Client reported she endorses lack of interest, excessive worry, depressed mood, and insomnia. Client reported there is no family history of mental health diagnoses.  I reported her appetite is limited and she does not eat much.  Client reported her primary care physician currently has her taking Effexor and Wellbutrin.  Client reported her doctors are titrating her off of Effexor to her taking Wellbutrin by itself.  Client reported she has tried Zoloft and Cymbalta in the past but stopped due to side effects of sleepwalking and talking in her sleep.  Client denied substance use history. Client denied suicidal, homicidal, hallucinations and delusions.  Client was screened for pain, nutrition, [place] suicide severity scale and the following S DOH:  GAD 7 : Generalized Anxiety Score 11/30/2020  Nervous, Anxious, on Edge 0  Control/stop worrying 1  Worry too much - different things 0  Trouble relaxing 0  Restless 0  Easily annoyed or irritable 1  Afraid - awful might happen 0  Total GAD 7 Score 2  Anxiety Difficulty Somewhat difficult   Flowsheet Row  Counselor from 11/30/2020 in [facility]  PHQ-9 Total Score 12        Treatment recommendations: Individual therapy and psychiatry  Therapist provided information on format of appointment (virtual or face to face).  The client was advised to call back or seek an in-person evaluation if the symptoms worsen or if the condition fails to improve as anticipated before the next scheduled appointment. Client was in agreement with treatment recommendations.    CCA Biopsychosocial Intake/Chief Complaint:  Client presents with a history of depression since she was 45 years old.  Current Symptoms/Problems: Client reported lack of interest, excessive worry, insomnia   Patient Reported Schizophrenia/Schizoaffective Diagnosis in Past: No   Type of Services Patient Feels are Needed: Individual therapy and psychiatry   Initial Clinical Notes/Concerns: No data recorded  Mental Health Symptoms Depression:   Change in energy/activity; Sleep (too much or little); Difficulty Concentrating; Fatigue; Hopelessness   Duration of Depressive symptoms:  Greater than two weeks   Mania:   None   Anxiety:    Tension; Worrying; Difficulty concentrating   Psychosis:   None   Duration of Psychotic symptoms: No data recorded  Trauma:   None   Obsessions:   None   Compulsions:   None   Inattention:   None   Hyperactivity/Impulsivity:   None   Oppositional/Defiant Behaviors:   None   Emotional Irregularity:   None   Other Mood/Personality Symptoms:  No data recorded   Mental Status Exam Appearance and self-care  Stature:   Average   Weight:   Average weight   Clothing:  Casual   Grooming:   Normal   Cosmetic use:   Age appropriate   Posture/gait:   Normal   Motor activity:   Not Remarkable   Sensorium  Attention:   Normal   Concentration:   Normal   Orientation:   X5   Recall/memory:   Normal   Affect and Mood  Affect:    Congruent   Mood:   Anxious   Relating  Eye contact:   Normal   Facial expression:   Responsive   Attitude toward examiner:   Cooperative   Thought and Language  Speech flow:  Clear and Coherent   Thought content:   Appropriate to Mood and Circumstances   Preoccupation:   None   Hallucinations:   None   Organization:  No data recorded  [employer] of Knowledge:   Good   Intelligence:   Average   Abstraction:   Normal   Judgement:   Good   Reality Testing:   Adequate   Insight:   Good   Decision Making:   Normal   Social Functioning  Social Maturity:   Responsible   Social Judgement:   Normal   Stress  Stressors:   Family conflict; Work   Coping Ability:   Set designer Deficits:   Communication   Supports:   Family     Religion: Religion/Spirituality Are You A Religious Person?: Yes  Leisure/Recreation: Leisure / Recreation Do You Have Hobbies?: Yes  Exercise/Diet: Exercise/Diet Do You Exercise?: No Have You Gained or Lost A Significant Amount of Weight in the Past Six Months?: No Do You Follow a Special Diet?: No Do You Have Any Trouble Sleeping?: Yes   CCA Employment/Education Employment/Work Situation:    Education: Education Did Garment/textile technologist From [employer]?: Yes Did Theme park manager?: Yes What Type of College Degree Do you Have?: Client reported she has her associates degree   CCA Family/Childhood History Family and Relationship History: Family history Marital status: Single Does patient have children?: Yes How many children?: 1 How is patient's relationship with their children?: Client reported she has a 44-year-old daughter  Childhood History:  Childhood History By whom was/is the patient raised?: Mother Additional childhood history information: Client reported she is from [place].  Client reported her childhood was really good.  Client reported her mother was  single. Does patient have siblings?: Yes Number of Siblings: 1 Description of patient's current relationship with siblings: Client reported she has a good relationship with her older brother. Did patient suffer any verbal/emotional/physical/sexual abuse as a child?: No Did patient suffer from severe childhood neglect?: No Has patient ever been sexually abused/assaulted/raped as an adolescent or adult?: No Was the patient ever a victim of a crime or a disaster?: No Witnessed domestic violence?: No Has patient been affected by domestic violence as an adult?: No  Child/Adolescent Assessment:     CCA Substance Use Alcohol/Drug Use: Alcohol / Drug Use History of alcohol / drug use?: No history of alcohol / drug abuse                         ASAM's:  Six Dimensions of Multidimensional Assessment  Dimension 1:  Acute Intoxication and/or Withdrawal Potential:      Dimension 2:  Biomedical Conditions and Complications:      Dimension 3:  Emotional, Behavioral, or Cognitive Conditions and Complications:     Dimension 4:  Readiness to Change:  Dimension 5:  Relapse, Continued use, or Continued Problem Potential:     Dimension 6:  Recovery/Living Environment:     ASAM Severity Score:    ASAM Recommended Level of Treatment:     Substance use Disorder (SUD)    Recommendations for Services/Supports/Treatments: Recommendations for Services/Supports/Treatments Recommendations For Services/Supports/Treatments: Individual Therapy, Medication Management  DSM5 Diagnoses: Patient Active Problem List   Diagnosis Date Noted   Sciatica, right side 11/14/2020   Abnormal LFTs 10/20/2020   Fatty liver    Elevated blood pressure reading 09/12/2020   Elevated ferritin 01/08/2018   Baker's cyst 06/08/2015   Insomnia 06/08/2014   Family history of breast cancer 02/24/2013   Preventative health care 10/04/2012   Depression 09/28/2012    Patient Centered Plan: Patient is on the  following Treatment Plan(s):  Depression   Referrals to Alternative Service(s): Referred to Alternative Service(s):   Place:   Date:   Time:    Referred to Alternative Service(s):   Place:   Date:   Time:    Referred to Alternative Service(s):   Place:   Date:   Time:    Referred to Alternative Service(s):   Place:   Date:   Time:     Loree Fee, LCSW

## 2021-01-16 DIAGNOSIS — M4316 Spondylolisthesis, lumbar region: Secondary | ICD-10-CM | POA: Diagnosis not present

## 2021-01-16 DIAGNOSIS — M5416 Radiculopathy, lumbar region: Secondary | ICD-10-CM | POA: Diagnosis not present

## 2021-01-16 DIAGNOSIS — M5459 Other low back pain: Secondary | ICD-10-CM | POA: Diagnosis not present

## 2021-01-20 ENCOUNTER — Other Ambulatory Visit: Payer: Self-pay | Admitting: Family

## 2021-01-23 ENCOUNTER — Ambulatory Visit: Payer: Medicaid Other | Admitting: Gastroenterology

## 2021-01-25 ENCOUNTER — Other Ambulatory Visit: Payer: Self-pay

## 2021-01-25 ENCOUNTER — Ambulatory Visit (HOSPITAL_COMMUNITY): Payer: Medicaid Other | Admitting: Clinical

## 2021-01-31 DIAGNOSIS — M5416 Radiculopathy, lumbar region: Secondary | ICD-10-CM | POA: Diagnosis not present

## 2021-02-01 ENCOUNTER — Ambulatory Visit (HOSPITAL_COMMUNITY): Payer: Medicaid Other | Admitting: Psychiatry

## 2021-02-08 ENCOUNTER — Ambulatory Visit (HOSPITAL_COMMUNITY): Payer: Medicaid Other | Admitting: Clinical

## 2021-02-09 DIAGNOSIS — M5451 Vertebrogenic low back pain: Secondary | ICD-10-CM | POA: Diagnosis not present

## 2021-02-20 DIAGNOSIS — M5416 Radiculopathy, lumbar region: Secondary | ICD-10-CM | POA: Diagnosis not present

## 2021-02-26 ENCOUNTER — Other Ambulatory Visit: Payer: Self-pay | Admitting: Family

## 2021-03-06 DIAGNOSIS — M5451 Vertebrogenic low back pain: Secondary | ICD-10-CM | POA: Diagnosis not present

## 2021-03-30 ENCOUNTER — Institutional Professional Consult (permissible substitution): Payer: Medicaid Other | Admitting: Plastic Surgery

## 2021-06-14 ENCOUNTER — Institutional Professional Consult (permissible substitution): Payer: Medicaid Other | Admitting: Plastic Surgery

## 2021-06-27 ENCOUNTER — Telehealth: Payer: Self-pay | Admitting: Family

## 2021-06-27 NOTE — Telephone Encounter (Signed)
Medication: buPROPion (WELLBUTRIN XL) 150 MG 24 hr tablet  ? ?Has the patient contacted their pharmacy? No. ? ? ?Preferred Pharmacy: [facility] [id number] - [place], [place] - [address]  ?[address], [place] [address]  ?Phone:  [phone]  Fax:  [phone]  ? ?

## 2021-06-28 ENCOUNTER — Institutional Professional Consult (permissible substitution): Payer: Medicaid Other | Admitting: Plastic Surgery

## 2021-07-02 ENCOUNTER — Ambulatory Visit: Payer: Medicaid Other | Admitting: Family

## 2021-07-03 ENCOUNTER — Telehealth: Payer: Self-pay | Admitting: Family

## 2021-07-03 ENCOUNTER — Other Ambulatory Visit: Payer: Self-pay

## 2021-07-03 ENCOUNTER — Ambulatory Visit (INDEPENDENT_AMBULATORY_CARE_PROVIDER_SITE_OTHER): Payer: Medicaid Other | Admitting: Family

## 2021-07-03 VITALS — BP 114/83 | HR 96 | Temp 98.6°F | Resp 16 | Wt 183.0 lb

## 2021-07-03 DIAGNOSIS — G4700 Insomnia, unspecified: Secondary | ICD-10-CM | POA: Diagnosis not present

## 2021-07-03 DIAGNOSIS — F32A Depression, unspecified: Secondary | ICD-10-CM | POA: Diagnosis not present

## 2021-07-03 MED ORDER — BUPROPION HCL ER (XL) 150 MG PO TB24: 150.0000 mg | ORAL_TABLET | Freq: Every day | ORAL | 0 refills | Status: DC

## 2021-07-03 MED ORDER — TRAZODONE HCL 50 MG PO TABS: ORAL_TABLET | ORAL | 0 refills | Status: DC

## 2021-07-03 NOTE — Patient Instructions (Signed)
Start trazodone 1/2 to 1 tablet once nightly.  ?

## 2021-07-03 NOTE — Assessment & Plan Note (Signed)
Stable on Wellbutrin, continue same. 

## 2021-07-03 NOTE — Telephone Encounter (Signed)
pt was just in office. Melissa did not refill her wellbutrin xl  ? ?Please advise  ?

## 2021-07-03 NOTE — Assessment & Plan Note (Addendum)
New. Likely exacerbated by wellbutrin, however she is doing really well on Wellbutrin mood wise and does not wish to discontinue it. In the meantime, will give trial of trazodone which she has done well on in the past.  ?

## 2021-07-03 NOTE — Progress Notes (Signed)
? ?Subjective:  ? ?By signing my name below, I, Elizabeth Reese, attest that this documentation has been prepared under the direction and in the presence of Debbrah Alar NP, 07/03/2021 ? ? Patient ID: Elizabeth Reese, female    DOB: [birth year], 46 y.o.   MRN: [id number] ? ?Chief Complaint  ?Patient presents with  ? Depression  ?  Here for follow up  ? Anxiety  ?  Here for follow up  ? Insomnia  ?  Complains of trouble staying sleep  ? ? ?HPI ?Patient is in today for an office visit. ? ?Insomnia - Patient states that she falls asleep with no complications. However, she tends to wake up about an hour later. She stays awake once woken up. If she falls back asleep, she reports that it is not a peaceful sleep.  She takes 5 MG of melatonin but it has not relieved symptoms. She does not consume caffeine.  ? ?Depression - Most days she reports feeling well. Since the past week, she has been feeling depressed due to situational events. She takes 150 MG Wellbutrin XL during the morning. She reports the medication has been helping her depression. She does not want to discontinue use of Wellbutrin XL. ? ?Back Pain - Her back pain has been consistent. Steroid shots have not helped.  ? ?Social History - She currently works about 3 days a week. ? ?Immunization - Patient declines the option for a flu vaccination. ? ?Health Maintenance Due  ?Topic Date Due  ? PAP SMEAR-Modifier  12/23/2020  ? ? ?Past Medical History:  ?Diagnosis Date  ? Anxiety   ? Baker's cyst of knee   ? Depression   ? doing good now  ? Fatty liver   ? Migraine   ? ? ?Past Surgical History:  ?Procedure Laterality Date  ? DILATION AND CURETTAGE OF UTERUS    ? THERAPEUTIC ABORTION    ? ? ?Family History  ?Problem Relation Age of Onset  ? Hypertension Mother   ? Arthritis Maternal Grandmother   ? Hypertension Maternal Grandmother   ? Cancer Maternal Grandmother 21  ?     breast  ? Colon cancer Maternal Grandfather   ? Hypertension Maternal Aunt   ? Breast  cancer Maternal Aunt   ? Diabetes Maternal Uncle   ? Pancreatic cancer Neg Hx   ? Stomach cancer Neg Hx   ? Throat cancer Neg Hx   ? Liver disease Neg Hx   ? ? ?Social History  ? ?Socioeconomic History  ? Marital status: Single  ?  Spouse name: Not on file  ? Number of children: 1  ? Years of education: Not on file  ? Highest education level: Not on file  ?Occupational History  ? Not on file  ?Tobacco Use  ? Smoking status: Former  ?  Types: Cigarettes  ?  Quit date: 08/25/2019  ?  Years since quitting: 1.8  ? Smokeless tobacco: Never  ? Tobacco comments:  ?  quit beginning of preg  ?Vaping Use  ? Vaping Use: Never used  ?Substance and Sexual Activity  ? Alcohol use: Yes  ?  Alcohol/week: 10.0 standard drinks  ?  Types: 10 Standard drinks or equivalent per week  ?  Comment: drink 2 glasses of wine daily  ? Drug use: No  ? Sexual activity: Yes  ?  Birth control/protection: None  ?Other Topics Concern  ? Not on file  ?Social History Narrative  ? Caffeine use:  No  ?  Regular exercise: no  ? Works as a Nurse, learning disability.  ? Single, no kids  ? Some college  ? Lots of family nearby.   ? One daughter 82  ? ?Social Determinants of Health  ? ?Financial Resource Strain: Not on file  ?Food Insecurity: Not on file  ?Transportation Needs: Not on file  ?Physical Activity: Not on file  ?Stress: Not on file  ?Social Connections: Not on file  ?Intimate Partner Violence: Not on file  ? ? ?Outpatient Medications Prior to Visit  ?Medication Sig Dispense Refill  ? folic acid (FOLVITE) 1 MG tablet Take 1 tablet (1 mg total) by mouth daily. 30 tablet 12  ? vitamin B-12 (CYANOCOBALAMIN) 1000 MCG tablet Take 1 tablet (1,000 mcg total) by mouth daily. 30 tablet 12  ? buPROPion (WELLBUTRIN XL) 150 MG 24 hr tablet TAKE ONE TABLET BY MOUTH DAILY 90 tablet 0  ? venlafaxine XR (EFFEXOR XR) 37.5 MG 24 hr capsule Take 1 tab by mouth every other day for 2 weeks then stop. 30 capsule 0  ? ?No facility-administered medications prior to  visit.  ? ? ?Allergies  ?Allergen Reactions  ? Azithromycin Itching and Rash  ? ? ?Review of Systems  ?Musculoskeletal:  Positive for back pain.  ?Psychiatric/Behavioral:  Positive for depression (Situational Depression). The patient has insomnia.   ? ?   ?Objective:  ?  ?Physical Exam ?Constitutional:   ?   General: She is not in acute distress. ?   Appearance: Normal appearance. She is not ill-appearing.  ?HENT:  ?   Head: Normocephalic and atraumatic.  ?   Right Ear: External ear normal.  ?   Left Ear: External ear normal.  ?Eyes:  ?   Extraocular Movements: Extraocular movements intact.  ?   Pupils: Pupils are equal, round, and reactive to light.  ?Cardiovascular:  ?   Rate and Rhythm: Normal rate and regular rhythm.  ?   Heart sounds: Normal heart sounds. No murmur heard. ?  No gallop.  ?Pulmonary:  ?   Effort: Pulmonary effort is normal. No respiratory distress.  ?   Breath sounds: Normal breath sounds. No wheezing or rales.  ?Skin: ?   General: Skin is warm and dry.  ?Neurological:  ?   Mental Status: She is alert and oriented to person, place, and time.  ?Psychiatric:     ?   Judgment: Judgment normal.  ? ? ?BP 114/83 (BP Location: Right Arm, Patient Position: Sitting, Cuff Size: Small)   Pulse 96   Temp 98.6 ?F (37 ?C) (Oral)   Resp 16   Wt 183 lb (83 kg)   SpO2 98%   BMI 30.45 kg/m?  ?Wt Readings from Last 3 Encounters:  ?07/03/21 183 lb (83 kg)  ?11/20/20 182 lb 6 oz (82.7 kg)  ?11/14/20 178 lb 12.8 oz (81.1 kg)  ? ? ?   ?Assessment & Plan:  ? ?Problem List Items Addressed This Visit   ? ?  ? Unprioritized  ? Insomnia - Primary  ?  Likely exacerbated by wellbutrin, however she is doing really well on Wellbutrin mood wise and does not wish to discontinue it. In the meantime, will give trial of trazodone which she has done well on in the past.  ?  ?  ? Depression  ?  Stable on Wellbutrin, continue same.  ?  ?  ? Relevant Medications  ? traZODone (DESYREL) 50 MG tablet  ? ? ? ?Meds ordered this  encounter  ?  Medications  ? traZODone (DESYREL) 50 MG tablet  ?  Sig: 1/2-1 tab by mouth once nightly as needed for sleep.  ?  Dispense:  90 tablet  ?  Refill:  0  ?  Order Specific Question:   Supervising Provider  ?  Answer:   Penni Homans A [id number]  ? ? ?I, Nance Pear, NP, personally preformed the services described in this documentation.  All medical record entries made by the scribe were at my direction and in my presence.  I have reviewed the chart and discharge instructions (if applicable) and agree that the record reflects my personal performance and is accurate and complete. 07/03/2021 ? ?I,Amber Collins,acting as a Education administrator for [employer], NP.,have documented all relevant documentation on the behalf of Nance Pear, NP,as directed by  Nance Pear, NP while in the presence of Nance Pear, NP. ? ? ? ?Nance Pear, NP ? ?

## 2021-07-03 NOTE — Telephone Encounter (Signed)
Rx was sent  

## 2021-08-13 ENCOUNTER — Telehealth (HOSPITAL_BASED_OUTPATIENT_CLINIC_OR_DEPARTMENT_OTHER): Payer: Self-pay

## 2021-09-03 ENCOUNTER — Telehealth: Payer: Self-pay

## 2021-09-03 NOTE — Telephone Encounter (Signed)
Pt called sating she just returned from a cruise and left her medications behind on the ship (trazadone and wellbutrin).  She is wondering if PCP will send in a month's supply for each med into [facility] on [address] until she sees PCP next month on 10/03/21.  Please advise. ?

## 2021-09-04 ENCOUNTER — Other Ambulatory Visit: Payer: Self-pay

## 2021-09-04 MED ORDER — BUPROPION HCL ER (XL) 150 MG PO TB24: 150.0000 mg | ORAL_TABLET | Freq: Every day | ORAL | 0 refills | Status: DC

## 2021-09-04 MED ORDER — TRAZODONE HCL 50 MG PO TABS: ORAL_TABLET | ORAL | 0 refills | Status: DC

## 2021-09-04 NOTE — Telephone Encounter (Signed)
Pt called to follow up on temp med request status. Pt was advised that Melissa did approve the temp supply but the Rx hadn't been sent in just yet. Pt would like a call to inform her of when it is sent in since she has gone two days without these meds and will need them soon.  ?

## 2021-09-04 NOTE — Telephone Encounter (Signed)
Prescriptions sent for 30 day supply

## 2021-09-25 DIAGNOSIS — M5416 Radiculopathy, lumbar region: Secondary | ICD-10-CM | POA: Diagnosis not present

## 2021-10-03 ENCOUNTER — Ambulatory Visit (INDEPENDENT_AMBULATORY_CARE_PROVIDER_SITE_OTHER): Payer: Medicaid Other | Admitting: Family

## 2021-10-03 ENCOUNTER — Encounter: Payer: Self-pay | Admitting: Family

## 2021-10-03 VITALS — BP 113/80 | HR 91 | Temp 98.5°F | Resp 16 | Ht 64.5 in | Wt 183.0 lb

## 2021-10-03 DIAGNOSIS — Z1211 Encounter for screening for malignant neoplasm of colon: Secondary | ICD-10-CM

## 2021-10-03 DIAGNOSIS — Z1239 Encounter for other screening for malignant neoplasm of breast: Secondary | ICD-10-CM

## 2021-10-03 DIAGNOSIS — R7989 Other specified abnormal findings of blood chemistry: Secondary | ICD-10-CM

## 2021-10-03 DIAGNOSIS — E785 Hyperlipidemia, unspecified: Secondary | ICD-10-CM | POA: Diagnosis not present

## 2021-10-03 DIAGNOSIS — G4700 Insomnia, unspecified: Secondary | ICD-10-CM

## 2021-10-03 DIAGNOSIS — F32A Depression, unspecified: Secondary | ICD-10-CM | POA: Diagnosis not present

## 2021-10-03 DIAGNOSIS — Z0000 Encounter for general adult medical examination without abnormal findings: Secondary | ICD-10-CM

## 2021-10-03 MED ORDER — BUPROPION HCL ER (XL) 150 MG PO TB24: 150.0000 mg | ORAL_TABLET | Freq: Every day | ORAL | 1 refills | Status: DC

## 2021-10-03 MED ORDER — TRAZODONE HCL 50 MG PO TABS: ORAL_TABLET | ORAL | 0 refills | Status: DC

## 2021-10-03 NOTE — Progress Notes (Signed)
Subjective:     Patient ID: Elizabeth Reese, female    DOB: [birth year], 46 y.o.   MRN: [id number]  Chief Complaint  Patient presents with   Annual Exam    HPI Patient is in today for cpx.  Immunizations: she has not had the covid or flu shot. She declines these vaccines.  Diet:  healthy Wt Readings from Last 3 Encounters:  10/03/21 183 lb (83 kg)  07/03/21 183 lb (83 kg)  11/20/20 182 lb 6 oz (82.7 kg)  Exercise: walks twice daily Colonoscopy: due Pap Smear: due Mammogram: due Vision: scheduled Dental: up to date, schedule  Has ESI for low back pain about 1 week ago with some improvement in her symptoms. Reports mild scoliosis and slipped disc.   Insomnia- last visit we gave the patient a trial of trazodone. Notes that it was very helpful.   Health Maintenance Due  Topic Date Due   COVID-19 Vaccine (1) Never done   PAP SMEAR-Modifier  12/23/2020   COLONOSCOPY (Pts 45-44yr Insurance coverage will need to be confirmed)  Never done    Past Medical History:  Diagnosis Date   Anxiety    Baker's cyst of knee    Depression    doing good now   Fatty liver    Migraine     Past Surgical History:  Procedure Laterality Date   DILATION AND CURETTAGE OF UTERUS     THERAPEUTIC ABORTION      Family History  Problem Relation Age of Onset   Hypertension Mother    Arthritis Maternal Grandmother    Hypertension Maternal Grandmother    Cancer Maternal Grandmother [phone]      breast   Colon cancer Maternal Grandfather    Hypertension Maternal Aunt    Breast cancer Maternal Aunt    Diabetes Maternal Uncle    Pancreatic cancer Neg Hx    Stomach cancer Neg Hx    Throat cancer Neg Hx    Liver disease Neg Hx     Social History   Socioeconomic History   Marital status: Single    Spouse name: Not on file   Number of children: 1   Years of education: Not on file   Highest education level: Not on file  Occupational History   Not on file  Tobacco Use   Smoking  status: Former    Types: Cigarettes    Quit date: 08/25/2019    Years since quitting: 2.1   Smokeless tobacco: Never   Tobacco comments:    quit beginning of preg  Vaping Use   Vaping Use: Never used  Substance and Sexual Activity   Alcohol use: Yes    Alcohol/week: 10.0 standard drinks of alcohol    Types: 10 Standard drinks or equivalent per week    Comment: drink 2 glasses of wine daily   Drug use: No   Sexual activity: Yes    Birth control/protection: None  Other Topics Concern   Not on file  Social History Narrative   Caffeine use:  No   Regular exercise: no   Works as a dSet designerfor [employer]   Single, no kids   Some college   Lots of family nearby.    One daughter 230  Social Determinants of Health   Financial Resource Strain: Not on file  Food Insecurity: Not on file  Transportation Needs: Not on file  Physical Activity: Not on file  Stress: Not on file  Social Connections: Not  on file  Intimate Partner Violence: Not on file    Outpatient Medications Prior to Visit  Medication Sig Dispense Refill   folic acid (FOLVITE) 1 MG tablet Take 1 tablet (1 mg total) by mouth daily. 30 tablet 12   vitamin B-12 (CYANOCOBALAMIN) 1000 MCG tablet Take 1 tablet (1,000 mcg total) by mouth daily. 30 tablet 12   buPROPion (WELLBUTRIN XL) 150 MG 24 hr tablet Take 1 tablet (150 mg total) by mouth daily. 30 tablet 0   traZODone (DESYREL) 50 MG tablet 1/2-1 tab by mouth once nightly as needed for sleep. 30 tablet 0   No facility-administered medications prior to visit.    Allergies  Allergen Reactions   Azithromycin Itching and Rash    Review of Systems  Constitutional:  Negative for weight loss.  HENT:  Negative for congestion and hearing loss.   Eyes:  Positive for blurred vision (blurred with reading only).  Respiratory:  Negative for cough.   Cardiovascular:  Negative for leg swelling.  Gastrointestinal:  Negative for constipation and diarrhea.   Genitourinary:  Negative for dysuria and frequency.  Musculoskeletal:  Positive for myalgias (some muscle pain with movement overlying left anterior rib cage). Negative for joint pain.  Skin:  Negative for rash.  Neurological:  Negative for headaches.  Psychiatric/Behavioral:         Stable overall       Objective:    Physical Exam  BP 113/80 (BP Location: Right Arm, Patient Position: Sitting, Cuff Size: Small)   Pulse 91   Temp 98.5 F (36.9 C) (Oral)   Resp 16   Ht 5' 4.5" (1.638 m)   Wt 183 lb (83 kg)   SpO2 98%   BMI 30.93 kg/m  Wt Readings from Last 3 Encounters:  10/03/21 183 lb (83 kg)  07/03/21 183 lb (83 kg)  11/20/20 182 lb 6 oz (82.7 kg)   Physical Exam  Constitutional: She is oriented to person, place, and time. She appears well-developed and well-nourished. No distress.  HENT:  Head: Normocephalic and atraumatic.  Right Ear: Tympanic membrane and ear canal normal.  Left Ear: Tympanic membrane and ear canal normal.  Mouth/Throat: Oropharynx is clear and moist.  Eyes: Pupils are equal, round, and reactive to light. No scleral icterus.  Neck: Normal range of motion. No thyromegaly present.  Cardiovascular: Normal rate and regular rhythm.   No murmur heard. Pulmonary/Chest: Effort normal and breath sounds normal. No respiratory distress. He has no wheezes. She has no rales. She exhibits no tenderness.  Abdominal: Soft. Bowel sounds are normal. She exhibits no distension and no mass. There is no tenderness. There is no rebound and no guarding.  Musculoskeletal: She exhibits no edema.  Lymphadenopathy:    She has no cervical adenopathy.  Neurological: She is alert and oriented to person, place, and time. She has normal patellar reflexes. She exhibits normal muscle tone. Coordination normal.  Skin: Skin is warm and dry.  Psychiatric: She has a normal mood and affect. Her behavior is normal. Judgment and thought content normal.  Breast/pelvic: deferred          Assessment & Plan:       Assessment & Plan:   Problem List Items Addressed This Visit       Unprioritized   Preventative health care - Primary    Discussed healthy diet, exercise and weight loss. She will update pap with gyn.       Insomnia    Improved on trazodone. Continue  same.       Depression    Fair control on current dose of wellbutrin.  I think a lot of her depression symptoms are situational. We discussed that she needs to do more for herself.  She is thinking about going back to work and putting her daughter into daycare which I think would be helpful for her. She is struggling with placing her daughter into the care of others.      Relevant Medications   traZODone (DESYREL) 50 MG tablet   buPROPion (WELLBUTRIN XL) 150 MG 24 hr tablet   Abnormal LFTs   Other Visit Diagnoses     Colon cancer screening       Relevant Orders   Ambulatory referral to Gastroenterology   Encounter for screening for malignant neoplasm of breast, unspecified screening modality       Relevant Orders   MM 3D SCREEN BREAST BILATERAL   Elevated LFTs       Relevant Orders   Comp Met (CMET)   Hyperlipidemia, unspecified hyperlipidemia type       Relevant Orders   Lipid panel       I am having Nayellie A. Cudd maintain her folic acid, vitamin J-49, traZODone, and buPROPion.  Meds ordered this encounter  Medications   traZODone (DESYREL) 50 MG tablet    Sig: 1/2-1 tab by mouth once nightly as needed for sleep.    Dispense:  90 tablet    Refill:  0    Order Specific Question:   Supervising Provider    Answer:   Penni Homans A [[id number]]   buPROPion (WELLBUTRIN XL) 150 MG 24 hr tablet    Sig: Take 1 tablet (150 mg total) by mouth daily.    Dispense:  90 tablet    Refill:  1    Order Specific Question:   Supervising Provider    Answer:   Penni Homans A [[id number]]

## 2021-10-03 NOTE — Patient Instructions (Signed)
Please add claritin 10 mg once daily for ear pressure and hand itching.

## 2021-10-04 NOTE — Addendum Note (Signed)
Addended by: Kelle Darting A on: 10/04/2021 03:26 PM   Modules accepted: Orders

## 2021-10-04 NOTE — Assessment & Plan Note (Signed)
Discussed healthy diet, exercise and weight loss. She will update pap with gyn.

## 2021-10-04 NOTE — Assessment & Plan Note (Signed)
Fair control on current dose of wellbutrin.  I think a lot of her depression symptoms are situational. We discussed that she needs to do more for herself.  She is thinking about going back to work and putting her daughter into daycare which I think would be helpful for her. She is struggling with placing her daughter into the care of others.

## 2021-10-04 NOTE — Assessment & Plan Note (Signed)
Improved on trazodone. Continue same.

## 2021-10-10 ENCOUNTER — Encounter: Payer: Self-pay | Admitting: Family

## 2021-10-10 ENCOUNTER — Telehealth: Payer: Self-pay | Admitting: Family

## 2021-10-10 ENCOUNTER — Other Ambulatory Visit (INDEPENDENT_AMBULATORY_CARE_PROVIDER_SITE_OTHER): Payer: Medicaid Other

## 2021-10-10 DIAGNOSIS — F1090 Alcohol use, unspecified, uncomplicated: Secondary | ICD-10-CM

## 2021-10-10 DIAGNOSIS — Z114 Encounter for screening for human immunodeficiency virus [HIV]: Secondary | ICD-10-CM | POA: Diagnosis not present

## 2021-10-10 DIAGNOSIS — E785 Hyperlipidemia, unspecified: Secondary | ICD-10-CM | POA: Diagnosis not present

## 2021-10-10 DIAGNOSIS — R7989 Other specified abnormal findings of blood chemistry: Secondary | ICD-10-CM

## 2021-10-10 HISTORY — DX: Alcohol use, unspecified, uncomplicated: F10.90

## 2021-10-10 LAB — COMPREHENSIVE METABOLIC PANEL
ALT: 51 U/L — ABNORMAL HIGH (ref 0–35)
AST: 116 U/L — ABNORMAL HIGH (ref 0–37)
Albumin: 4.3 g/dL (ref 3.5–5.2)
Alkaline Phosphatase: 69 U/L (ref 39–117)
BUN: 10 mg/dL (ref 6–23)
CO2: 25 mEq/L (ref 19–32)
Calcium: 9.8 mg/dL (ref 8.4–10.5)
Chloride: 100 mEq/L (ref 96–112)
Creatinine, Ser: 0.69 mg/dL (ref 0.40–1.20)
GFR: 105 mL/min (ref >60.00)
Glucose, Bld: 94 mg/dL (ref 70–99)
Potassium: 4 mEq/L (ref 3.5–5.1)
Sodium: 136 mEq/L (ref 135–145)
Total Bilirubin: 0.9 mg/dL (ref 0.2–1.2)
Total Protein: 7.1 g/dL (ref 6.0–8.3)

## 2021-10-10 LAB — LIPID PANEL
Cholesterol: 230 mg/dL — ABNORMAL HIGH (ref 0–200)
HDL: 148.1 mg/dL (ref >39.00)
LDL Cholesterol: 69 mg/dL (ref 0–99)
NonHDL: 81.5
Total CHOL/HDL Ratio: 2
Triglycerides: 63 mg/dL (ref 0.0–149.0)
VLDL: 12.6 mg/dL (ref 0.0–40.0)

## 2021-10-10 NOTE — Addendum Note (Signed)
Addended by: Mervin Kung A on: 10/10/2021 11:15 AM   Modules accepted: Orders

## 2021-10-10 NOTE — Progress Notes (Signed)
v

## 2021-10-10 NOTE — Telephone Encounter (Signed)
See mychart.  

## 2021-10-11 LAB — HIV ANTIBODY (ROUTINE TESTING W REFLEX): HIV 1&2 Ab, 4th Generation: NONREACTIVE

## 2021-10-12 NOTE — Telephone Encounter (Signed)
Patient advised of all the information on the message. Number to GI given to patient .  She reports she has been drinking 5 to 6 glasses of wine every day and not taking B12. She verbalized understanding the advise from provider and will start taking B12.

## 2021-10-12 NOTE — Telephone Encounter (Signed)
Can you please review unread mychart message with patient? tks

## 2021-10-16 ENCOUNTER — Telehealth (HOSPITAL_BASED_OUTPATIENT_CLINIC_OR_DEPARTMENT_OTHER): Payer: Self-pay

## 2021-10-18 IMAGING — MG MM DIGITAL SCREENING BILAT W/ TOMO AND CAD
8 series · 8 of 24 positions shown · non-contrast
Comparison: None.

CLINICAL DATA: Screening.

EXAM:
DIGITAL SCREENING BILATERAL MAMMOGRAM WITH TOMOSYNTHESIS AND CAD
TECHNIQUE: Bilateral screening digital craniocaudal and mediolateral oblique
mammograms were obtained. Bilateral screening digital breast
tomosynthesis was performed. The images were evaluated with
computer-aided detection.

[L MLO synth-2D]
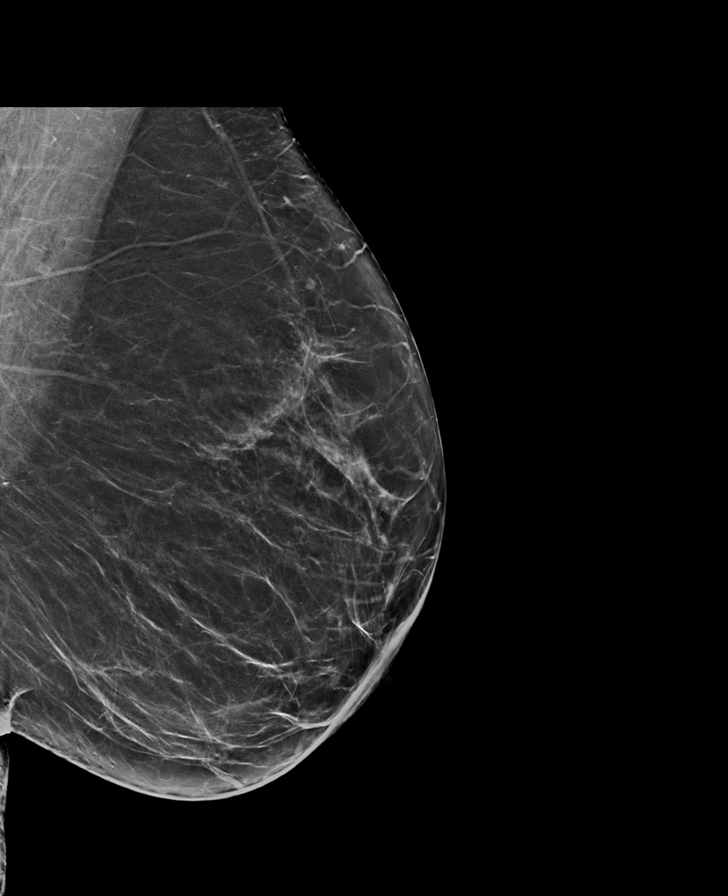

[R CC synth-2D]
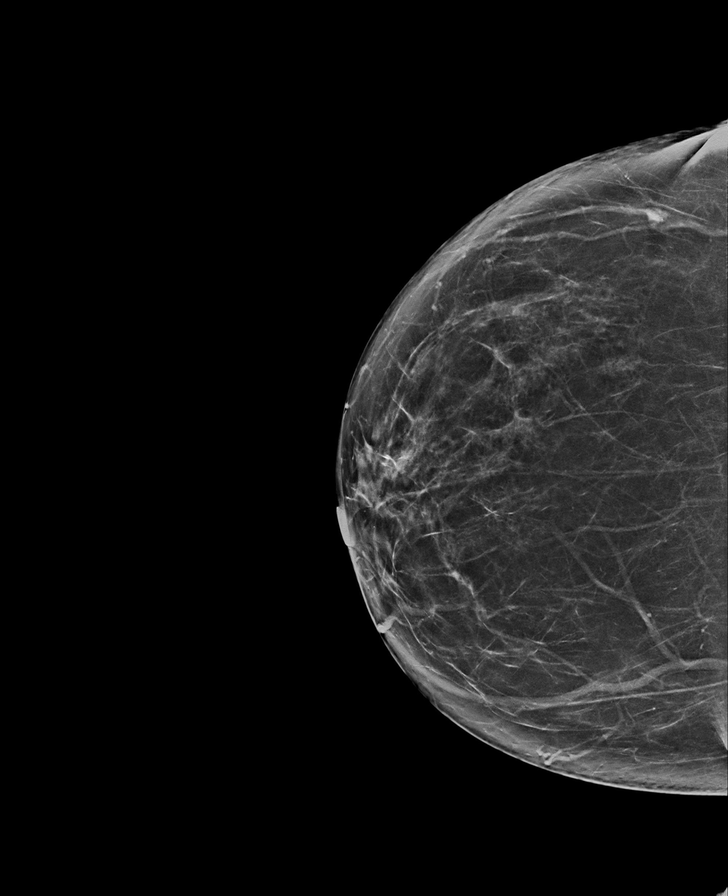

[R MLO synth-2D]
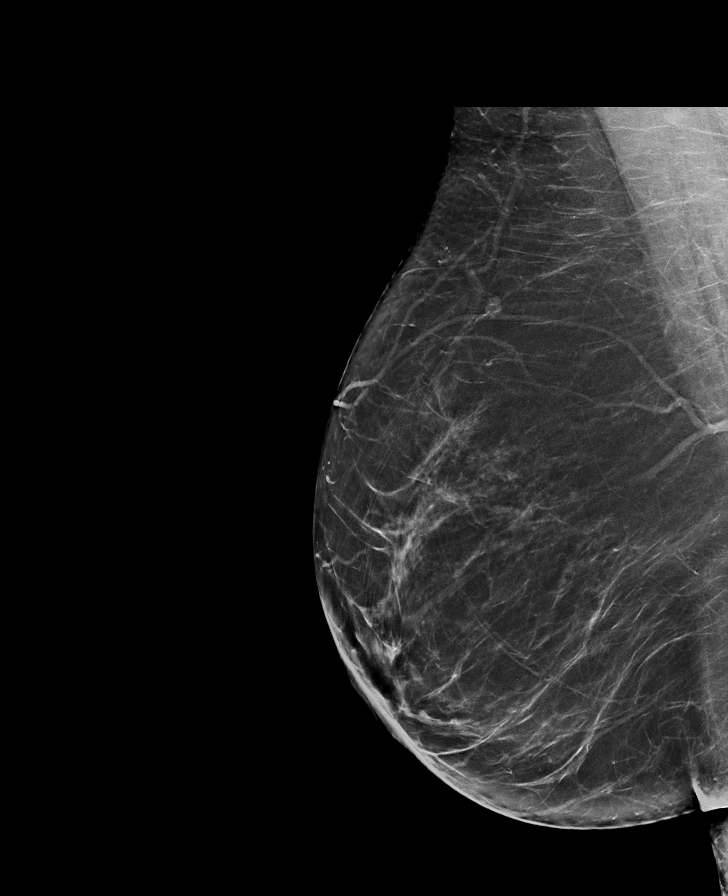

[L CC synth-2D]
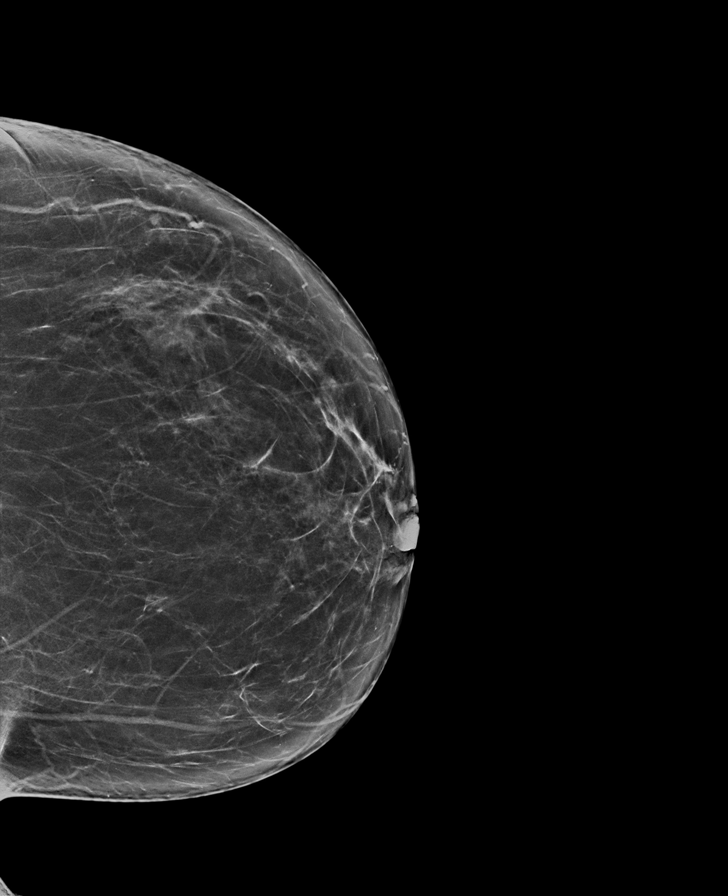

[L MLO tomo · tomo slice 37/74.0]
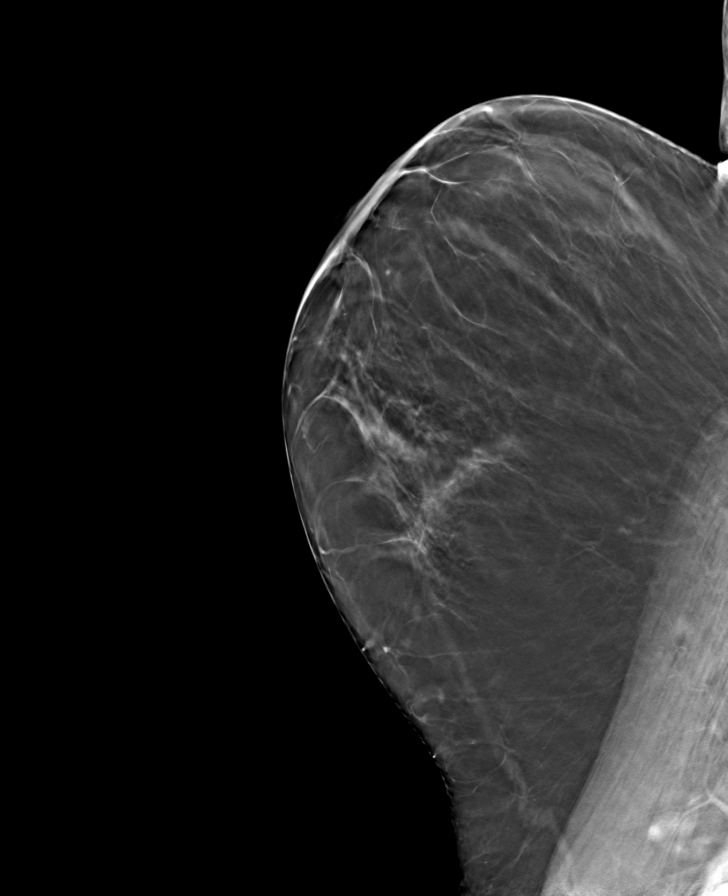

[R MLO tomo · tomo slice 41/81.0]
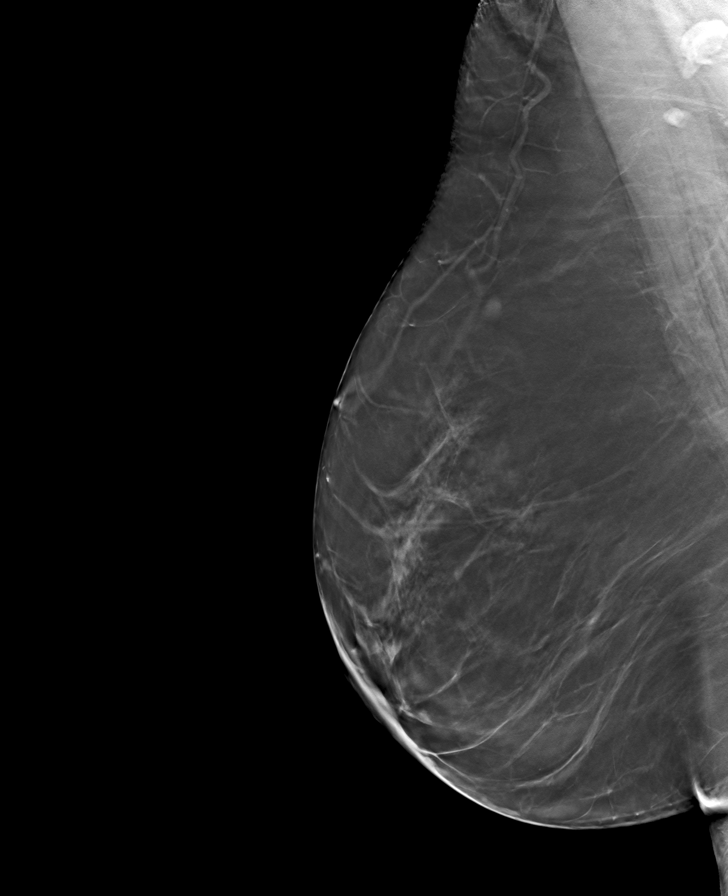

[L CC tomo · tomo slice 36/71.0]
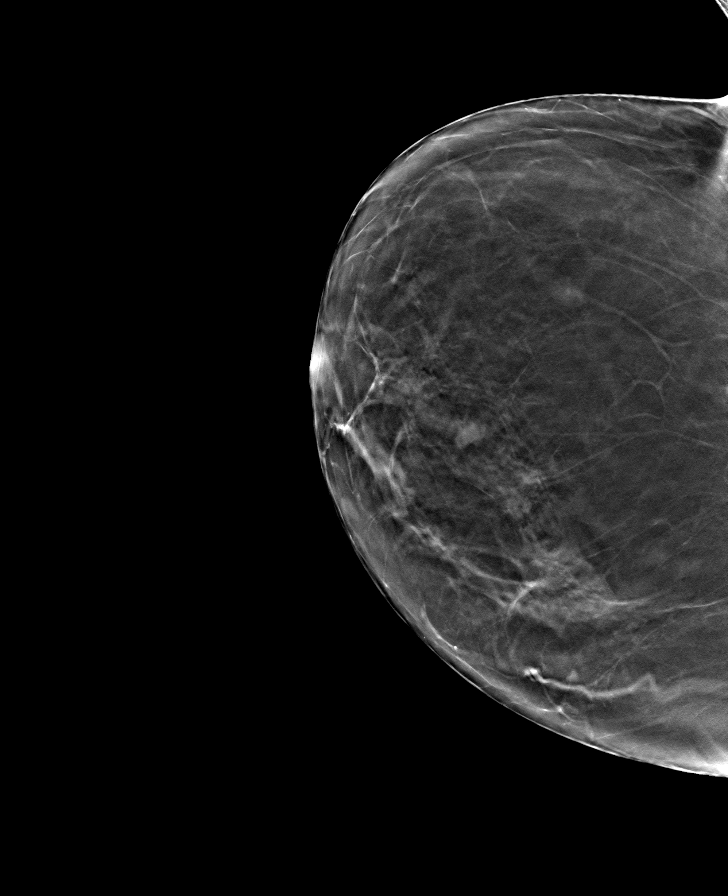

[R CC tomo · tomo slice 37/72.0]
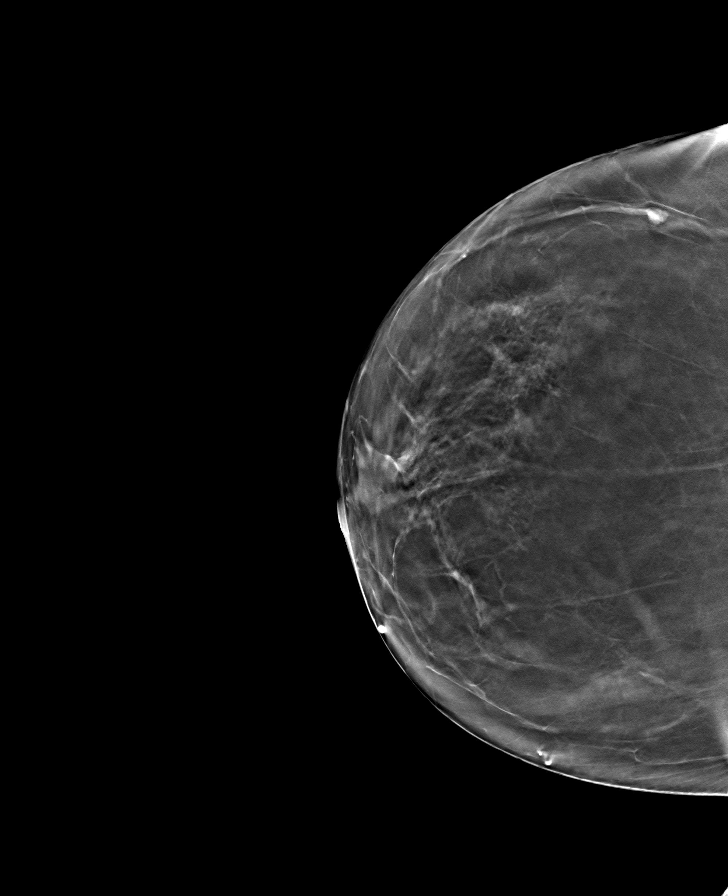

[8 of 24 positions shown; findings below may reference images not displayed]

ACR Breast Density Category b: There are scattered areas of
fibroglandular density.
FINDINGS: There are no findings suspicious for malignancy. The images were
evaluated with computer-aided detection.
IMPRESSION: No mammographic evidence of malignancy. A result letter of this
screening mammogram will be mailed directly to the patient.

RECOMMENDATION:
Screening mammogram in one year. (Code:C7-6-ASJ)

BI-RADS CATEGORY  1: Negative.

## 2021-12-07 ENCOUNTER — Ambulatory Visit: Payer: Medicaid Other | Admitting: Family

## 2021-12-07 DIAGNOSIS — M25562 Pain in left knee: Secondary | ICD-10-CM | POA: Diagnosis not present

## 2021-12-11 DIAGNOSIS — M48062 Spinal stenosis, lumbar region with neurogenic claudication: Secondary | ICD-10-CM | POA: Diagnosis not present

## 2021-12-11 DIAGNOSIS — M5416 Radiculopathy, lumbar region: Secondary | ICD-10-CM | POA: Diagnosis not present

## 2021-12-20 DIAGNOSIS — M25562 Pain in left knee: Secondary | ICD-10-CM | POA: Diagnosis not present

## 2021-12-27 DIAGNOSIS — M25562 Pain in left knee: Secondary | ICD-10-CM | POA: Diagnosis not present

## 2022-01-15 DIAGNOSIS — H5213 Myopia, bilateral: Secondary | ICD-10-CM | POA: Diagnosis not present

## 2022-02-28 DIAGNOSIS — M5416 Radiculopathy, lumbar region: Secondary | ICD-10-CM | POA: Diagnosis not present

## 2022-11-25 ENCOUNTER — Ambulatory Visit (HOSPITAL_BASED_OUTPATIENT_CLINIC_OR_DEPARTMENT_OTHER)
Admission: RE | Admit: 2022-11-25 | Discharge: 2022-11-25 | Disposition: A | Discharge status: Home / Self Care | Payer: Medicaid Other | Source: Ambulatory Visit | Attending: Orthopedic Surgery | Admitting: Orthopedic Surgery

## 2022-11-25 ENCOUNTER — Other Ambulatory Visit (HOSPITAL_BASED_OUTPATIENT_CLINIC_OR_DEPARTMENT_OTHER): Payer: Self-pay | Admitting: Orthopedic Surgery

## 2022-11-25 DIAGNOSIS — M25562 Pain in left knee: Secondary | ICD-10-CM | POA: Diagnosis not present

## 2022-11-25 DIAGNOSIS — M7989 Other specified soft tissue disorders: Secondary | ICD-10-CM | POA: Diagnosis not present

## 2022-11-25 DIAGNOSIS — M7121 Synovial cyst of popliteal space [Baker], right knee: Secondary | ICD-10-CM | POA: Diagnosis not present

## 2022-12-03 ENCOUNTER — Encounter: Payer: Medicaid Other | Admitting: Family

## 2022-12-03 ENCOUNTER — Encounter: Payer: Self-pay | Admitting: Family

## 2022-12-03 ENCOUNTER — Ambulatory Visit (INDEPENDENT_AMBULATORY_CARE_PROVIDER_SITE_OTHER): Payer: Medicaid Other | Admitting: Family

## 2022-12-03 VITALS — BP 121/81 | HR 87 | Temp 97.9°F | Resp 16 | Ht 65.0 in | Wt 187.2 lb

## 2022-12-03 DIAGNOSIS — Z0001 Encounter for general adult medical examination with abnormal findings: Secondary | ICD-10-CM

## 2022-12-03 DIAGNOSIS — H9190 Unspecified hearing loss, unspecified ear: Secondary | ICD-10-CM | POA: Diagnosis not present

## 2022-12-03 DIAGNOSIS — M2550 Pain in unspecified joint: Secondary | ICD-10-CM

## 2022-12-03 DIAGNOSIS — G2581 Restless legs syndrome: Secondary | ICD-10-CM | POA: Diagnosis not present

## 2022-12-03 DIAGNOSIS — Z0000 Encounter for general adult medical examination without abnormal findings: Secondary | ICD-10-CM

## 2022-12-03 DIAGNOSIS — Z1211 Encounter for screening for malignant neoplasm of colon: Secondary | ICD-10-CM

## 2022-12-03 DIAGNOSIS — E785 Hyperlipidemia, unspecified: Secondary | ICD-10-CM | POA: Diagnosis not present

## 2022-12-03 DIAGNOSIS — Z1231 Encounter for screening mammogram for malignant neoplasm of breast: Secondary | ICD-10-CM

## 2022-12-03 LAB — COMPREHENSIVE METABOLIC PANEL
ALT: 43 U/L — ABNORMAL HIGH (ref 0–35)
AST: 65 U/L — ABNORMAL HIGH (ref 0–37)
Albumin: 4.6 g/dL (ref 3.5–5.2)
Alkaline Phosphatase: 67 U/L (ref 39–117)
BUN: 13 mg/dL (ref 6–23)
CO2: 25 mEq/L (ref 19–32)
Calcium: 9.8 mg/dL (ref 8.4–10.5)
Chloride: 98 mEq/L (ref 96–112)
Creatinine, Ser: 0.7 mg/dL (ref 0.40–1.20)
GFR: 103.79 mL/min (ref >60.00)
Glucose, Bld: 86 mg/dL (ref 70–99)
Potassium: 3.7 mEq/L (ref 3.5–5.1)
Sodium: 136 mEq/L (ref 135–145)
Total Bilirubin: 0.9 mg/dL (ref 0.2–1.2)
Total Protein: 7.8 g/dL (ref 6.0–8.3)

## 2022-12-03 LAB — LIPID PANEL
Cholesterol: 244 mg/dL — ABNORMAL HIGH (ref 0–200)
HDL: 154.9 mg/dL (ref >39.00)
LDL Cholesterol: 79 mg/dL (ref 0–99)
NonHDL: 89.43
Total CHOL/HDL Ratio: 2
Triglycerides: 52 mg/dL (ref 0.0–149.0)
VLDL: 10.4 mg/dL (ref 0.0–40.0)

## 2022-12-03 LAB — SEDIMENTATION RATE: Sed Rate: 11 mm/hr (ref 0–20)

## 2022-12-03 MED ORDER — ROPINIROLE HCL 0.25 MG PO TABS: 0.2500 mg | ORAL_TABLET | Freq: Every day | ORAL | 3 refills | Status: DC

## 2022-12-03 NOTE — Assessment & Plan Note (Addendum)
Reports difficulty sleeping due to sensation of needing to move legs. -Start Requip at bedtime, can increase dose if needed.

## 2022-12-03 NOTE — Assessment & Plan Note (Signed)
Recent onset, improved with prednisone. No evidence of blood clots on recent DVT.  Possible autoimmune etiology. -Order rheumatoid arthritis screen, lupus screen, and inflammatory markers. -Continue prednisone as prescribed, last dose on Friday.

## 2022-12-03 NOTE — Patient Instructions (Signed)
VISIT SUMMARY:  During your recent visit, we discussed your sudden onset of swelling in both lower legs, which has improved with prednisone. We also talked about your difficulty sleeping due to restless leg syndrome, a condition that gives you an uncontrollable urge to move your legs. We reviewed your general health, including your alcohol consumption,  weight gain, and your efforts to improve your diet and exercise more regularly.  YOUR PLAN:  -BILATERAL LOWER EXTREMITY JOINT SWELLING: This is swelling in both of your lower legs. We're not sure of the cause yet, but we're going to run some tests to check for conditions like rheumatoid arthritis and lupus. In the meantime, continue taking your prednisone as prescribed.  -RESTLESS LEG SYNDROME: This is a condition that gives you an uncontrollable urge to move your legs, especially when you're trying to sleep. We're going to start you on a medication called Requip, which you'll take at bedtime. If needed, we can increase the dose.  -GENERAL HEALTH MAINTENANCE: We're going to order some routine screenings for you, including a Pap smear, colonoscopy, and mammogram. We also recommend getting your annual COVID vaccination and considering a shingles vaccination when you turn 50. We encourage you to reduce your alcohol intake to one glass or less per day and will check your cholesterol levels due to previous elevations. We'll also order a hearing test since you've reported possible hearing issues.

## 2022-12-03 NOTE — Progress Notes (Signed)
Subjective:     Patient ID: Elizabeth Reese, female    DOB: 1975-08-31, 47 y.o.   MRN: [id number]  No chief complaint on file.   HPI  Discussed the use of AI scribe software for clinical note transcription with the patient, who gave verbal consent to proceed.  History of Present Illness   The patient presents today for a complete physical. She notes that she recently had an episode of knee/ankle swelling and pain bilaterally which was treated by her ortho and has improved with prednisone. The patient, with a history of elevated cholesterol and alcohol use, presents with a recent episode of bilateral leg swelling. The swelling was severe and occurred suddenly, causing significant discomfort. The patient was treated with prednisone for ten days, which significantly improved the swelling. The patient also reports symptoms suggestive of restless leg syndrome, including difficulty sleeping due to an uncontrollable urge to move the legs. The patient has a history of taking Wellbutrin for mood but has stopped taking it. The patient also reports a history of alcohol use but has cut back recently. The patient is not sexually active and does not use tobacco or vape.   Immunizations: up to date Diet: healthy Exercise:  not regularly.  Wt Readings from Last 3 Encounters:  12/03/22 187 lb 3.2 oz (84.9 kg)  10/03/21 183 lb (83 kg)  07/03/21 183 lb (83 kg)  Colonoscopy: due Pap Smear: due  Mammogram:due  Vision: up to date Dental: scheduled     Health Maintenance Due  Topic Date Due   PAP SMEAR-Modifier  12/23/2020   Colonoscopy  Never done   COVID-19 Vaccine (1 - 2023-24 season) Never done   INFLUENZA VACCINE  11/28/2022    Past Medical History:  Diagnosis Date   Alcohol use disorder 10/10/2021   Anxiety    Baker's cyst of knee    Depression    doing good now   Fatty liver    Migraine     Past Surgical History:  Procedure Laterality Date   DILATION AND CURETTAGE OF UTERUS      THERAPEUTIC ABORTION      Family History  Problem Relation Age of Onset   Hypertension Mother    Osteoarthritis Mother    Arthritis Maternal Grandmother    Hypertension Maternal Grandmother    Cancer Maternal Grandmother 39       breast   Colon cancer Maternal Grandfather    Hypertension Maternal Aunt    Breast cancer Maternal Aunt    Diabetes Maternal Uncle    Pancreatic cancer Neg Hx    Stomach cancer Neg Hx    Throat cancer Neg Hx    Liver disease Neg Hx     Social History   Socioeconomic History   Marital status: Single    Spouse name: Not on file   Number of children: 1   Years of education: Not on file   Highest education level: Not on file  Occupational History   Not on file  Tobacco Use   Smoking status: Former    Current packs/day: 0.00    Types: Cigarettes    Quit date: 08/25/2019    Years since quitting: 3.2   Smokeless tobacco: Never   Tobacco comments:    quit beginning of preg  Vaping Use   Vaping status: Never Used  Substance and Sexual Activity   Alcohol use: Yes    Alcohol/week: 14.0 standard drinks of alcohol    Types: 14 Standard drinks or  equivalent per week    Comment: drink 2 glasses of wine daily   Drug use: No   Sexual activity: Not Currently    Birth control/protection: None  Other Topics Concern   Not on file  Social History Narrative   Caffeine use:  No   Regular exercise: no   Works as a Engineer, structural.   Single, no kids   Some college   Lots of family nearby.    One daughter 43   Social Determinants of Health   Financial Resource Strain: Low Risk  (07/28/2018)   Overall Financial Resource Strain (CARDIA)    Difficulty of Paying Living Expenses: Not hard at all  Food Insecurity: No Food Insecurity (07/28/2018)   Hunger Vital Sign    Worried About Running Out of Food in the Last Year: Never true    Ran Out of Food in the Last Year: Never true  Transportation Needs: Unknown (07/28/2018)   PRAPARE -  Administrator, Civil Service (Medical): No    Lack of Transportation (Non-Medical): Not on file  Physical Activity: Not on file  Stress: No Stress Concern Present (07/28/2018)   [employer] of Occupational Health - Occupational Stress Questionnaire    Feeling of Stress : Only a little  Social Connections: Not on file  Intimate Partner Violence: Not At Risk (07/28/2018)   Humiliation, Afraid, Rape, and Kick questionnaire    Fear of Current or Ex-Partner: No    Emotionally Abused: No    Physically Abused: No    Sexually Abused: No    Outpatient Medications Prior to Visit  Medication Sig Dispense Refill   predniSONE (DELTASONE) 10 MG tablet take 1 tab three times a day for 2 days, 1 tab twice a day for 5 days, 1 tab daily till finished     buPROPion (WELLBUTRIN XL) 150 MG 24 hr tablet Take 1 tablet (150 mg total) by mouth daily. 90 tablet 1   folic acid (FOLVITE) 1 MG tablet Take 1 tablet (1 mg total) by mouth daily. 30 tablet 12   traZODone (DESYREL) 50 MG tablet 1/2-1 tab by mouth once nightly as needed for sleep. 90 tablet 0   vitamin B-12 (CYANOCOBALAMIN) 1000 MCG tablet Take 1 tablet (1,000 mcg total) by mouth daily. 30 tablet 12   No facility-administered medications prior to visit.    Allergies  Allergen Reactions   Azithromycin Itching and Rash    Review of Systems  Constitutional:  Negative for weight loss.  HENT:  Positive for hearing loss. Negative for congestion.   Eyes:  Negative for blurred vision.  Respiratory:  Negative for cough.   Gastrointestinal:  Negative for constipation and diarrhea.  Genitourinary:  Negative for dysuria and frequency.  Musculoskeletal:  Positive for joint pain. Negative for neck pain.  Neurological:  Negative for headaches.  Psychiatric/Behavioral:  Negative for depression.        Objective:    Physical Exam   BP 121/81 (BP Location: Right Arm, Patient Position: Sitting, Cuff Size: Small)   Pulse 87   Temp 97.9  F (36.6 C) (Oral)   Resp 16   Ht 5\' 5"  (1.651 m)   Wt 187 lb 3.2 oz (84.9 kg)   SpO2 98%   BMI 31.15 kg/m  Wt Readings from Last 3 Encounters:  12/03/22 187 lb 3.2 oz (84.9 kg)  10/03/21 183 lb (83 kg)  07/03/21 183 lb (83 kg)   Physical Exam  Constitutional: She is oriented  to person, place, and time. She appears well-developed and well-nourished. No distress.  HENT:  Head: Normocephalic and atraumatic.  Right Ear: Tympanic membrane and ear canal normal.  Left Ear: Tympanic membrane and ear canal normal.  Mouth/Throat: Oropharynx is clear and moist.  Eyes: Pupils are equal, round, and reactive to light. No scleral icterus.  Neck: Normal range of motion. No thyromegaly present.  Cardiovascular: Normal rate and regular rhythm.   No murmur heard. Pulmonary/Chest: Effort normal and breath sounds normal. No respiratory distress. He has no wheezes. She has no rales. She exhibits no tenderness.  Abdominal: Soft. Bowel sounds are normal. She exhibits no distension and no mass. There is no tenderness. There is no rebound and no guarding.  Musculoskeletal: She exhibits some mild knee and ankle swelling Lymphadenopathy:    She has no cervical adenopathy.  Neurological: She is alert and oriented to person, place, and time. She has normal patellar reflexes. She exhibits normal muscle tone. Coordination normal.             Assessment & Plan:       Assessment & Plan:   Problem List Items Addressed This Visit       Unprioritized   Restless leg    Reports difficulty sleeping due to sensation of needing to move legs. -Start Requip at bedtime, can increase dose if needed.       Preventative health care - Primary    -Overdue for Pap smear, patient to schedule with gynecologist. -Order colonoscopy for colorectal cancer screening. -Order mammogram for breast cancer screening. -Consider annual COVID vaccination. -Consider shingles vaccination at age 40. -Encourage reduction of  alcohol intake to one glass per day. -Check cholesterol levels, discuss dietary changes to reduce cholesterol.      Polyarthralgia    Recent onset, improved with prednisone. No evidence of blood clots on recent DVT.  Possible autoimmune etiology. -Order rheumatoid arthritis screen, lupus screen, and inflammatory markers. -Continue prednisone as prescribed, last dose on Friday.       Relevant Orders   Antinuclear Antib (ANA)   Sedimentation rate   Rheumatoid Factor   Hyperlipidemia   Relevant Orders   Lipid panel   Comp Met (CMET)   Hearing disorder    Reports possible hearing issues, willing to have hearing test. -Order hearing test.      Relevant Orders   Ambulatory referral to Audiology   Other Visit Diagnoses     Screening for colon cancer       Relevant Orders   Ambulatory referral to Gastroenterology   Breast cancer screening by mammogram       Relevant Orders   MM 3D SCREENING MAMMOGRAM BILATERAL BREAST       I have discontinued Pansy A. Consuegra's folic acid, cyanocobalamin, traZODone, and buPROPion. I am also having her start on rOPINIRole. Additionally, I am having her maintain her predniSONE.  Meds ordered this encounter  Medications   rOPINIRole (REQUIP) 0.25 MG tablet    Sig: Take 1 tablet (0.25 mg total) by mouth at bedtime.    Dispense:  30 tablet    Refill:  3    Order Specific Question:   Supervising Provider    Answer:   Danise Edge A [[id number]]

## 2022-12-03 NOTE — Assessment & Plan Note (Addendum)
-  Overdue for Pap smear, patient to schedule with gynecologist. -Order colonoscopy for colorectal cancer screening. -Order mammogram for breast cancer screening. -Consider annual COVID vaccination. -Consider shingles vaccination at age 47. -Encourage reduction of alcohol intake to one glass of wine or less per day. -Check cholesterol levels, discuss dietary changes to reduce cholesterol.

## 2022-12-03 NOTE — Assessment & Plan Note (Signed)
Reports possible hearing issues, willing to have hearing test. -Order hearing test.

## 2022-12-06 DIAGNOSIS — M7989 Other specified soft tissue disorders: Secondary | ICD-10-CM | POA: Diagnosis not present

## 2022-12-06 DIAGNOSIS — M5451 Vertebrogenic low back pain: Secondary | ICD-10-CM | POA: Diagnosis not present

## 2022-12-08 ENCOUNTER — Telehealth: Payer: Self-pay | Admitting: Family

## 2022-12-08 DIAGNOSIS — R7989 Other specified abnormal findings of blood chemistry: Secondary | ICD-10-CM

## 2022-12-08 DIAGNOSIS — M2550 Pain in unspecified joint: Secondary | ICD-10-CM

## 2022-12-08 NOTE — Telephone Encounter (Signed)
Please advise patient that her lab work shows elevated liver function testing.  Although it is better than last time it is still elevated and suggests too much alcohol use.  I would recommend that she discontinue alcohol completely and repeat LFT in 1 month.  If still elevated, then I would like to have her meet with the GI specialist.   Cholesterol is mildly elevated.  Continue work on [employer], exercise and weight loss.  Autoimmune testing is borderline, give the fact that she has recent joint swelling, I will refer her to rheumatology for further evaluation.

## 2022-12-09 ENCOUNTER — Ambulatory Visit (HOSPITAL_BASED_OUTPATIENT_CLINIC_OR_DEPARTMENT_OTHER)
Admission: RE | Admit: 2022-12-09 | Discharge: 2022-12-09 | Disposition: A | Discharge status: Home / Self Care | Payer: Medicaid Other | Source: Ambulatory Visit | Attending: Family | Admitting: Family

## 2022-12-09 DIAGNOSIS — Z1231 Encounter for screening mammogram for malignant neoplasm of breast: Secondary | ICD-10-CM | POA: Insufficient documentation

## 2022-12-09 NOTE — Telephone Encounter (Signed)
Patient notified of results, referral, provider's comments and advise. She was scheduled to return in one month for labs.

## 2022-12-23 ENCOUNTER — Telehealth: Payer: Self-pay | Admitting: Family

## 2022-12-23 DIAGNOSIS — M2550 Pain in unspecified joint: Secondary | ICD-10-CM

## 2022-12-23 NOTE — Telephone Encounter (Signed)
Pt states the rheumatologist she was referred to cannot see her until January and she would like to know if she can be referred elsewhere.

## 2023-01-06 DIAGNOSIS — M7989 Other specified soft tissue disorders: Secondary | ICD-10-CM | POA: Diagnosis not present

## 2023-01-13 ENCOUNTER — Other Ambulatory Visit: Payer: Medicaid Other

## 2023-01-15 ENCOUNTER — Other Ambulatory Visit: Payer: Self-pay | Admitting: *Deleted

## 2023-01-15 DIAGNOSIS — M7989 Other specified soft tissue disorders: Secondary | ICD-10-CM

## 2023-01-21 ENCOUNTER — Ambulatory Visit (HOSPITAL_COMMUNITY)
Admission: RE | Admit: 2023-01-21 | Discharge: 2023-01-21 | Disposition: A | Discharge status: Home / Self Care | Payer: Medicaid Other | Source: Ambulatory Visit | Attending: Vascular Surgery | Admitting: Vascular Surgery

## 2023-01-21 DIAGNOSIS — M7989 Other specified soft tissue disorders: Secondary | ICD-10-CM | POA: Insufficient documentation

## 2023-01-22 ENCOUNTER — Ambulatory Visit (INDEPENDENT_AMBULATORY_CARE_PROVIDER_SITE_OTHER): Payer: Medicaid Other | Admitting: Vascular Surgery

## 2023-01-22 ENCOUNTER — Encounter: Payer: Self-pay | Admitting: Vascular Surgery

## 2023-01-22 VITALS — BP 110/73 | HR 97 | Temp 97.4°F | Resp 18 | Ht 64.0 in | Wt 196.3 lb

## 2023-01-22 DIAGNOSIS — I872 Venous insufficiency (chronic) (peripheral): Secondary | ICD-10-CM | POA: Diagnosis not present

## 2023-01-22 DIAGNOSIS — M7989 Other specified soft tissue disorders: Secondary | ICD-10-CM | POA: Diagnosis not present

## 2023-01-22 NOTE — Progress Notes (Signed)
Leg swelling  ASSESSMENT & PLAN   CHRONIC VENOUS INSUFFICIENCY: This patient does have evidence of chronic venous insufficiency with both superficial and deep venous reflux.  She has CEAP C4 venous disease (hyperpigmentation).  We have discussed conservative measures.  I have encouraged her to avoid prolonged sitting and standing.  We discussed importance of exercise specifically walking and water aerobics.  I discussed the use of compression stockings.  I would start with a knee-high compression stocking with a gradient of 15 to 20 mmHg.  If this is not successful then she could try a thigh-high stocking with a gradient of 20 to 30 mmHg.  I have encouraged her to elevate her legs daily and we have discussed the proper positioning for this.  In addition we discussed the importance of maintaining a healthy weight.  The only thing puzzling about her history is that the swelling began fairly acutely 2 months ago.  She is scheduled to see rheumatology shortly and I would see what they have to say.  If the swelling worsens I think the next logical step would be a CT venogram of the abdomen and pelvis to rule out any DVT in the pelvis or pelvic mass although I think this is unlikely.  If no other etiology for her swelling can be found I think she could be considered for laser ablation of the left great saphenous vein to help lower venous pressure.  She will need formal venous reflux testing on the left at some point.  If her symptoms progress she will let us know and we will get her fitted and compression stockings and can order a CT venogram of the abdomen and pelvis.  REASON FOR CONSULT:    Bilateral lower extremity pain and swelling.  The consult is requested by Dr. Darrelyn Hillock.  HPI:   Elizabeth Reese is a 47 y.o. female who was referred with bilateral lower extremity swelling.  I have reviewed the records from the referring office.  The patient was placed on steroids and this did help the swelling some.   She did have a venous duplex scan on 11/25/2022 which showed no evidence of DVT in either lower extremity.  On my history, the patient developed the rather sudden onset of bilateral lower extremity swelling and pain about 2 months ago.  She does not remember any specific injury to her legs.  She has no previous history of DVT.  She has had no previous venous procedures.  She gets pain with standing.  Her pain gets better when she walks but then it hurts again when she stops walking.  Elevation also helps her pain.  Her skin is very tender to touch.  She has been on steroids now and is scheduled to see rheumatology.  She had a negative venous duplex scan of both lower extremities on 11/25/2022.  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  Past Medical History:  Diagnosis Date   Alcohol use disorder 10/10/2021   Anxiety    Baker's cyst of knee    Depression    doing good now   Fatty liver    Migraine     Family History  Problem Relation Age of Onset   Hypertension Mother    Osteoarthritis Mother    Arthritis Maternal Grandmother    Hypertension Maternal Grandmother    Cancer Maternal Grandmother 75  breast   Colon cancer Maternal Grandfather    Hypertension Maternal Aunt    Breast cancer Maternal Aunt    Diabetes Maternal Uncle    Pancreatic cancer Neg Hx    Stomach cancer Neg Hx    Throat cancer Neg Hx    Liver disease Neg Hx     SOCIAL HISTORY: Social History   Tobacco Use   Smoking status: Former    Current packs/day: 0.00    Types: Cigarettes    Quit date: 08/25/2019    Years since quitting: 3.4   Smokeless tobacco: Never   Tobacco comments:    quit beginning of preg  Substance Use Topics   Alcohol use: Yes    Alcohol/week: 14.0 standard drinks of alcohol    Types: 14 Standard drinks or equivalent per week    Comment: drink 2 glasses of wine daily    Allergies  Allergen  Reactions   Azithromycin Itching and Rash    Current Outpatient Medications  Medication Sig Dispense Refill   predniSONE (DELTASONE) 10 MG tablet take 1 tab three times a day for 2 days, 1 tab twice a day for 5 days, 1 tab daily till finished     rOPINIRole (REQUIP) 0.25 MG tablet Take 1 tablet (0.25 mg total) by mouth at bedtime. 30 tablet 3   No current facility-administered medications for this visit.    REVIEW OF SYSTEMS:  [X]  denotes positive finding, [ ]  denotes negative finding Cardiac  Comments:  Chest pain or chest pressure:    Shortness of breath upon exertion:    Short of breath when lying flat:    Irregular heart rhythm:        Vascular    Pain in calf, thigh, or hip brought on by ambulation: x   Pain in feet at night that wakes you up from your sleep:  x   Blood clot in your veins:    Leg swelling:  x       Pulmonary    Oxygen at home:    Productive cough:     Wheezing:         Neurologic    Sudden weakness in arms or legs:     Sudden numbness in arms or legs:     Sudden onset of difficulty speaking or slurred speech:    Temporary loss of vision in one eye:     Problems with dizziness:         Gastrointestinal    Blood in stool:     Vomited blood:         Genitourinary    Burning when urinating:     Blood in urine:        Psychiatric    Major depression:         Hematologic    Bleeding problems:    Problems with blood clotting too easily:        Skin    Rashes or ulcers:        Constitutional    Fever or chills:    -  PHYSICAL EXAM:   Vitals:   01/22/23 1349  BP: 110/73  Pulse: 97  Resp: 18  Temp: (!) 97.4 F (36.3 C)  TempSrc: Temporal  SpO2: 95%  Weight: 196 lb 4.8 oz (89 kg)  Height: 5\' 4"  (1.626 m)   Body mass index is 33.69 kg/m. GENERAL: The patient is a well-nourished female, in no acute distress. The vital signs are documented above. CARDIAC: There  is a regular rate and rhythm.  VASCULAR: I do not detect carotid  bruits. Patient has palpable femoral pulses and palpable pedal pulses bilaterally.  She has a biphasic dorsalis pedis and posterior tibial signal bilaterally. She does have varicose veins bilaterally and also some spider veins and reticular veins bilaterally. I looked at her right great saphenous vein which does have significant reflux in the right thigh and is significantly dilated. She does have some mild hyperpigmentation. PULMONARY: There is good air exchange bilaterally without wheezing or rales. ABDOMEN: Soft and non-tender with normal pitched bowel sounds.  MUSCULOSKELETAL: There are no major deformities. NEUROLOGIC: No focal weakness or paresthesias are detected. SKIN: There are no ulcers or rashes noted. PSYCHIATRIC: The patient has a normal affect.  DATA:    VENOUS DUPLEX: I have reviewed the venous duplex scan that was done yesterday of the right lower extremity.  There was no evidence of DVT.  There was deep venous reflux in the common femoral vein.  There was superficial venous reflux in the right great saphenous vein from the saphenofemoral junction to the mid thigh.  Diameters of the vein ranged from 7.6-8.6 mm.  The results of the study are summarized in the diagram below.    Waverly [facility]

## 2023-02-13 DIAGNOSIS — R7989 Other specified abnormal findings of blood chemistry: Secondary | ICD-10-CM | POA: Diagnosis not present

## 2023-02-13 DIAGNOSIS — R21 Rash and other nonspecific skin eruption: Secondary | ICD-10-CM | POA: Diagnosis not present

## 2023-02-13 DIAGNOSIS — R768 Other specified abnormal immunological findings in serum: Secondary | ICD-10-CM | POA: Diagnosis not present

## 2023-02-13 DIAGNOSIS — D7589 Other specified diseases of blood and blood-forming organs: Secondary | ICD-10-CM | POA: Diagnosis not present

## 2023-02-13 DIAGNOSIS — R52 Pain, unspecified: Secondary | ICD-10-CM | POA: Diagnosis not present

## 2023-02-13 DIAGNOSIS — R600 Localized edema: Secondary | ICD-10-CM | POA: Diagnosis not present

## 2023-02-24 ENCOUNTER — Other Ambulatory Visit: Payer: Self-pay

## 2023-02-24 DIAGNOSIS — I872 Venous insufficiency (chronic) (peripheral): Secondary | ICD-10-CM

## 2023-03-04 ENCOUNTER — Other Ambulatory Visit (HOSPITAL_COMMUNITY): Payer: Self-pay | Admitting: Internal Medicine

## 2023-03-04 DIAGNOSIS — R7989 Other specified abnormal findings of blood chemistry: Secondary | ICD-10-CM

## 2023-03-04 DIAGNOSIS — R52 Pain, unspecified: Secondary | ICD-10-CM

## 2023-03-04 DIAGNOSIS — R600 Localized edema: Secondary | ICD-10-CM

## 2023-03-06 ENCOUNTER — Ambulatory Visit (INDEPENDENT_AMBULATORY_CARE_PROVIDER_SITE_OTHER): Payer: Medicaid Other

## 2023-03-06 DIAGNOSIS — R52 Pain, unspecified: Secondary | ICD-10-CM | POA: Diagnosis not present

## 2023-03-06 DIAGNOSIS — R600 Localized edema: Secondary | ICD-10-CM

## 2023-03-06 DIAGNOSIS — R7989 Other specified abnormal findings of blood chemistry: Secondary | ICD-10-CM | POA: Diagnosis not present

## 2023-03-06 LAB — ECHOCARDIOGRAM COMPLETE
AR max vel: 2.72 cm2
AV Area VTI: 2.82 cm2
AV Area mean vel: 2.65 cm2
AV Mean grad: 3 mm[Hg]
AV Peak grad: 4.8 mm[Hg]
Ao pk vel: 1.1 m/s
Area-P 1/2: 4.6 cm2
S' Lateral: 2.23 cm

## 2023-03-06 NOTE — Addendum Note (Signed)
Addended by: Leilani Able, Sydnee Lamour A on: 03/06/2023 10:54 AM   Modules accepted: Orders

## 2023-03-10 ENCOUNTER — Ambulatory Visit (HOSPITAL_BASED_OUTPATIENT_CLINIC_OR_DEPARTMENT_OTHER): Payer: Medicaid Other

## 2023-03-14 ENCOUNTER — Encounter (HOSPITAL_BASED_OUTPATIENT_CLINIC_OR_DEPARTMENT_OTHER): Payer: Self-pay | Admitting: *Deleted

## 2023-03-14 ENCOUNTER — Ambulatory Visit (HOSPITAL_BASED_OUTPATIENT_CLINIC_OR_DEPARTMENT_OTHER)
Admission: RE | Admit: 2023-03-14 | Discharge: 2023-03-14 | Disposition: A | Discharge status: Home / Self Care | Payer: Medicaid Other | Source: Ambulatory Visit | Attending: Vascular Surgery

## 2023-03-14 DIAGNOSIS — K8020 Calculus of gallbladder without cholecystitis without obstruction: Secondary | ICD-10-CM | POA: Diagnosis not present

## 2023-03-14 DIAGNOSIS — R600 Localized edema: Secondary | ICD-10-CM | POA: Diagnosis not present

## 2023-03-14 DIAGNOSIS — I872 Venous insufficiency (chronic) (peripheral): Secondary | ICD-10-CM | POA: Diagnosis present

## 2023-03-14 MED ORDER — IOHEXOL 350 MG/ML SOLN
125.0000 mL | Freq: Once | INTRAVENOUS | Status: AC | PRN
  Administered 2023-03-14: 125 mL via INTRAVENOUS

## 2023-03-26 ENCOUNTER — Encounter: Payer: Self-pay | Admitting: Vascular Surgery

## 2023-03-26 ENCOUNTER — Ambulatory Visit: Payer: Medicaid Other | Admitting: Vascular Surgery

## 2023-03-26 VITALS — BP 111/77 | HR 96 | Temp 97.6°F | Ht 64.0 in | Wt 196.4 lb

## 2023-03-26 DIAGNOSIS — I872 Venous insufficiency (chronic) (peripheral): Secondary | ICD-10-CM | POA: Diagnosis not present

## 2023-03-26 NOTE — Progress Notes (Signed)
Patient ID: Elizabeth Reese, female   DOB: 1976-02-03, 47 y.o.   MRN: [id number]  Reason for Consult: Follow-up   Referred by Sandford Craze, NP  Subjective:     HPI:  Elizabeth Reese is a 47 y.o. female with significant bilateral lower extremity swelling that presented acutely several months ago.  This has been present since her last evaluation.  She does not wear compression stockings as she was not fitted for those on last evaluation.  She did undergo CT scanning for evaluation of central venous process.  She has also been evaluated by rheumatology.  She states that in the morning her legs feel normal but after day goes on and caring for her 2-year-old her legs become incredibly edematous and exquisitely tender to palpation and very painful to stand.  She does not have any history of DVT.  Past Medical History:  Diagnosis Date   Alcohol use disorder 10/10/2021   Anxiety    Baker's cyst of knee    Depression    doing good now   Fatty liver    Migraine    Peripheral vascular disease (HCC)    Family History  Problem Relation Age of Onset   Hypertension Mother    Osteoarthritis Mother    Arthritis Maternal Grandmother    Hypertension Maternal Grandmother    Cancer Maternal Grandmother 66       breast   Colon cancer Maternal Grandfather    Hypertension Maternal Aunt    Breast cancer Maternal Aunt    Diabetes Maternal Uncle    Pancreatic cancer Neg Hx    Stomach cancer Neg Hx    Throat cancer Neg Hx    Liver disease Neg Hx    Past Surgical History:  Procedure Laterality Date   DILATION AND CURETTAGE OF UTERUS     THERAPEUTIC ABORTION      Short Social History:  Social History   Tobacco Use   Smoking status: Former    Current packs/day: 0.00    Types: Cigarettes    Quit date: 08/25/2019    Years since quitting: 3.5   Smokeless tobacco: Never   Tobacco comments:    quit beginning of preg  Substance Use Topics   Alcohol use: Yes    Alcohol/week: 14.0  standard drinks of alcohol    Types: 14 Standard drinks or equivalent per week    Comment: drink 2 glasses of wine daily    Allergies  Allergen Reactions   Azithromycin Itching and Rash    Current Outpatient Medications  Medication Sig Dispense Refill   predniSONE (DELTASONE) 10 MG tablet take 1 tab three times a day for 2 days, 1 tab twice a day for 5 days, 1 tab daily till finished     rOPINIRole (REQUIP) 0.25 MG tablet Take 1 tablet (0.25 mg total) by mouth at bedtime. 30 tablet 3   traMADol (ULTRAM) 50 MG tablet Take 50 mg by mouth every 6 (six) hours as needed.     No current facility-administered medications for this visit.    Review of Systems  Constitutional:  Constitutional negative. HENT: HENT negative.  Eyes: Eyes negative.  Respiratory: Respiratory negative.  Cardiovascular: Positive for leg swelling.  GI: Gastrointestinal negative.  Musculoskeletal: Positive for leg pain.  Neurological: Neurological negative. Hematologic: Hematologic/lymphatic negative.  Psychiatric: Psychiatric negative.        Objective:  Objective   Vitals:   03/26/23 1221  BP: 111/77  Pulse: 96  Temp: 97.6 F (36.4  C)  SpO2: 97%     Physical Exam HENT:     Head: Normocephalic.     Nose: Nose normal.  Eyes:     Pupils: Pupils are equal, round, and reactive to light.  Cardiovascular:     Rate and Rhythm: Normal rate.     Pulses: Normal pulses.  Pulmonary:     Effort: Pulmonary effort is normal.  Abdominal:     General: Abdomen is flat.  Musculoskeletal:        General: Normal range of motion.     Cervical back: Normal range of motion.     Right lower leg: Edema present.     Left lower leg: Edema present.     Comments: Multiple varicosities right medial thigh extending down the right leg  Skin:    General: Skin is warm.     Capillary Refill: Capillary refill takes less than 2 seconds.  Neurological:     General: No focal deficit present.     Mental Status: She is  alert.  Psychiatric:        Mood and Affect: Mood normal.        Thought Content: Thought content normal.        Judgment: Judgment normal.     Data: CT venogram IMPRESSION: 1. No evidence of May-Thurner syndrome.  The IVC is widely patent. 2. The bilateral greater saphenous veins give rise to several hypertrophied though widely patent superficial varicosities about the bilateral thighs, knees and proximal calves bilaterally, left-greater-than-right. 3. Cholelithiasis without evidence of acute cholecystitis.     Venous Reflux Times  +--------------+---------+------+-----------+------------+-----------------  --+  RIGHT        Reflux NoRefluxReflux TimeDiameter cmsComments                                      Yes                                               +--------------+---------+------+-----------+------------+-----------------  --+  CFV                    yes   >1 second                                   +--------------+---------+------+-----------+------------+-----------------  --+  FV mid        no                                                          +--------------+---------+------+-----------+------------+-----------------  --+  Popliteal    no                                                          +--------------+---------+------+-----------+------------+-----------------  --+  GSV at SFJ              yes    >500  ms      0.75                          +--------------+---------+------+-----------+------------+-----------------  --+  GSV prox thigh          yes    >500 ms      0.86    branches vv,  reflux  +--------------+---------+------+-----------+------------+-----------------  --+  GSV mid thigh           yes    >500 ms      0.76                          +--------------+---------+------+-----------+------------+-----------------  --+  GSV dist thighno                            0.43                           +--------------+---------+------+-----------+------------+-----------------  --+  GSV at knee   no                            0.44    VV discomfort         +--------------+---------+------+-----------+------------+-----------------  --+  GSV prox calf           yes    >500 ms      0.49                          +--------------+---------+------+-----------+------------+-----------------  --+  GSV mid calf  no                            0.30    numerous vv           +--------------+---------+------+-----------+------------+-----------------  --+  SSV Pop Fossa no                            0.62                          +--------------+---------+------+-----------+------------+-----------------  --+  SSV prox calf no                            0.47                          +--------------+---------+------+-----------+------------+-----------------  --+  SSV mid calf            yes    >500 ms      0.38                          +--------------+---------+------+-----------+------------+-----------------  --+  AASV O                  yes    >500 ms      0.38                          +--------------+---------+------+-----------+------------+-----------------  --+  AASV         no  0.21                          +--------------+---------+------+-----------+------------+-----------------  --+    Summary:  Right:  - No evidence of deep vein thrombosis seen in the right lower extremity,  from the common femoral through the popliteal veins.  - Venous reflux is noted in the right common femoral vein.  - Venous reflux is noted in the right sapheno-femoral junction.  - Venous reflux is noted in the right greater saphenous vein in the thigh.  - Venous reflux is noted in the right greater saphenous vein in the calf.  - Venous reflux is noted in the right short saphenous vein.  - Venous  reflux at origin of AASV.         Assessment/Plan:    47 year old female with C3 venous disease right lower extremity with significant nonpitting edema which is very painful.  We discussed the need for thigh-high stockings bilaterally and the timing to wear these prior to getting out of bed throughout the day when she is on her feet they can be taken off when she is recumbent and elevating her legs which does help with the swelling.  Today on exam her legs are quite edematous and tight and this is stable for her.  She also has multiple varicosities on the right medial thigh extending down the leg which are associated with the great saphenous vein which we evaluated with ultrasound today.  Plan will be to fit with thigh-high stockings and follow-up in 3 months to consider right greater saphenous vein ablation and stab phlebectomy for treatment what is likely just 1 component of her significant.  She demonstrates good understanding of our conversation today.    Maeola Harman MD [facility]

## 2023-04-02 DIAGNOSIS — I872 Venous insufficiency (chronic) (peripheral): Secondary | ICD-10-CM | POA: Diagnosis not present

## 2023-04-03 DIAGNOSIS — R600 Localized edema: Secondary | ICD-10-CM | POA: Diagnosis not present

## 2023-04-03 DIAGNOSIS — E538 Deficiency of other specified B group vitamins: Secondary | ICD-10-CM | POA: Diagnosis not present

## 2023-04-03 DIAGNOSIS — I872 Venous insufficiency (chronic) (peripheral): Secondary | ICD-10-CM | POA: Diagnosis not present

## 2023-04-03 DIAGNOSIS — D7589 Other specified diseases of blood and blood-forming organs: Secondary | ICD-10-CM | POA: Diagnosis not present

## 2023-04-24 NOTE — Progress Notes (Deleted)
 Office Visit Note  Patient: Elizabeth Reese             Date of Birth: 10-14-1975           MRN: [id number]             PCP: Sandford Craze, NP Referring: Sandford Craze, NP Visit Date: 05/08/2023 Occupation: @GUAROCC @  Subjective:  No chief complaint on file.   History of Present Illness: Elizabeth Reese is a 47 y.o. female ***     Activities of Daily Living:  Patient reports morning stiffness for *** {minute/hour:19697}.   Patient {ACTIONS;DENIES/REPORTS:21021675::"Denies"} nocturnal pain.  Difficulty dressing/grooming: {ACTIONS;DENIES/REPORTS:21021675::"Denies"} Difficulty climbing stairs: {ACTIONS;DENIES/REPORTS:21021675::"Denies"} Difficulty getting out of chair: {ACTIONS;DENIES/REPORTS:21021675::"Denies"} Difficulty using hands for taps, buttons, cutlery, and/or writing: {ACTIONS;DENIES/REPORTS:21021675::"Denies"}  No Rheumatology ROS completed.   PMFS History:  Patient Active Problem List   Diagnosis Date Noted   Restless leg 12/03/2022   Polyarthralgia 12/03/2022   Hearing disorder 12/03/2022   Hyperlipidemia 12/03/2022   Alcohol use disorder 10/10/2021   Sciatica, right side 11/14/2020   Abnormal LFTs 10/20/2020   Fatty liver    Elevated blood pressure reading 09/12/2020   Elevated ferritin 01/08/2018   Baker's cyst 06/08/2015   Insomnia 06/08/2014   Family history of breast cancer 02/24/2013   Preventative health care 10/04/2012   Depression 09/28/2012    Past Medical History:  Diagnosis Date   Alcohol use disorder 10/10/2021   Anxiety    Baker's cyst of knee    Depression    doing good now   Fatty liver    Migraine    Peripheral vascular disease (HCC)     Family History  Problem Relation Age of Onset   Hypertension Mother    Osteoarthritis Mother    Arthritis Maternal Grandmother    Hypertension Maternal Grandmother    Cancer Maternal Grandmother 28       breast   Colon cancer Maternal Grandfather    Hypertension Maternal  Aunt    Breast cancer Maternal Aunt    Diabetes Maternal Uncle    Pancreatic cancer Neg Hx    Stomach cancer Neg Hx    Throat cancer Neg Hx    Liver disease Neg Hx    Past Surgical History:  Procedure Laterality Date   DILATION AND CURETTAGE OF UTERUS     THERAPEUTIC ABORTION     Social History   Social History Narrative   Caffeine use:  No   Regular exercise: no   Works as a Librarian, academic for [employer].   Single, no kids   Some college   Lots of family nearby.    One daughter 2020   Immunization History  Administered Date(s) Administered   Influenza,inj,Quad PF,6+ Mos 02/19/2018   Tdap 07/19/2011, 05/15/2018     Objective: Vital Signs: There were no vitals taken for this visit.   Physical Exam   Musculoskeletal Exam: ***  CDAI Exam: CDAI Score: -- Patient Global: --; Provider Global: -- Swollen: --; Tender: -- Joint Exam 05/08/2023   No joint exam has been documented for this visit   There is currently no information documented on the homunculus. Go to the Rheumatology activity and complete the homunculus joint exam.  Investigation: No additional findings.  Imaging: No results found.  Recent Labs: Lab Results  Component Value Date   WBC 3.9 (L) 09/12/2020   HGB 12.4 09/12/2020   PLT 218.0 09/12/2020   NA 136 12/03/2022   K 3.7 12/03/2022   CL 98 12/03/2022  CO2 25 12/03/2022   GLUCOSE 86 12/03/2022   BUN 13 12/03/2022   CREATININE 0.70 12/03/2022   BILITOT 0.9 12/03/2022   ALKPHOS 67 12/03/2022   AST 65 (H) 12/03/2022   ALT 43 (H) 12/03/2022   PROT 7.8 12/03/2022   ALBUMIN 4.6 12/03/2022   CALCIUM 9.8 12/03/2022   GFRAA >89 10/02/2012    Speciality Comments: No specialty comments available.  Procedures:  No procedures performed Allergies: Azithromycin   Assessment / Plan:     Visit Diagnoses: No diagnosis found.  Orders: No orders of the defined types were placed in this encounter.  No orders of the defined types were  placed in this encounter.   Face-to-face time spent with patient was *** minutes. Greater than 50% of time was spent in counseling and coordination of care.  Follow-Up Instructions: No follow-ups on file.   Pollyann Savoy, MD  Note - This record has been created using Animal nutritionist.  Chart creation errors have been sought, but may not always  have been located. Such creation errors do not reflect on  the standard of medical care.

## 2023-05-08 ENCOUNTER — Encounter: Payer: Medicaid Other | Admitting: Rheumatology

## 2023-05-14 DIAGNOSIS — L308 Other specified dermatitis: Secondary | ICD-10-CM | POA: Diagnosis not present

## 2023-05-19 ENCOUNTER — Other Ambulatory Visit: Payer: Self-pay | Admitting: Family

## 2023-06-05 ENCOUNTER — Ambulatory Visit: Payer: Medicaid Other | Admitting: Rheumatology

## 2023-07-12 ENCOUNTER — Other Ambulatory Visit: Payer: Self-pay | Admitting: Family

## 2023-07-23 ENCOUNTER — Ambulatory Visit: Payer: Medicaid Other | Admitting: Vascular Surgery

## 2023-08-12 ENCOUNTER — Other Ambulatory Visit: Payer: Self-pay | Admitting: Family

## 2023-08-20 ENCOUNTER — Other Ambulatory Visit: Payer: Self-pay

## 2023-08-20 MED ORDER — ROPINIROLE HCL 0.25 MG PO TABS: 0.2500 mg | ORAL_TABLET | Freq: Every day | ORAL | 0 refills | Status: DC

## 2023-11-17 ENCOUNTER — Other Ambulatory Visit: Payer: Self-pay | Admitting: Family

## 2023-11-17 MED ORDER — ROPINIROLE HCL 0.25 MG PO TABS: 0.2500 mg | ORAL_TABLET | Freq: Every day | ORAL | 0 refills | Status: DC

## 2023-11-17 NOTE — Telephone Encounter (Signed)
 Copied from CRM [id number]. Topic: Clinical - Medication Refill >> Nov 17, 2023 12:05 PM Aleatha C wrote: Medication:  rOPINIRole  (REQUIP ) 0.25 MG tablet   Has the patient contacted their pharmacy? Yes (Agent: If no, request that the patient contact the pharmacy for the refill. If patient does not wish to contact the pharmacy document the reason why and proceed with request.) (Agent: If yes, when and what did the pharmacy advise?)  This is the patient's preferred pharmacy:    [facility] [address] [place], [place] - [address] [place] [address] Phone: [phone] Fax: [phone]  Is this the correct pharmacy for this prescription? Yes If no, delete pharmacy and type the correct one.   Has the prescription been filled recently? No  Is the patient out of the medication? Yes  Has the patient been seen for an appointment in the last year OR does the patient have an upcoming appointment? Yes  Can we respond through MyChart? No  Agent: Please be advised that Rx refills may take up to 3 business days. We ask that you follow-up with your pharmacy.

## 2023-12-05 DIAGNOSIS — L219 Seborrheic dermatitis, unspecified: Secondary | ICD-10-CM | POA: Diagnosis not present

## 2023-12-17 ENCOUNTER — Encounter: Admitting: Family

## 2023-12-23 DIAGNOSIS — M79672 Pain in left foot: Secondary | ICD-10-CM | POA: Diagnosis not present

## 2024-02-02 DIAGNOSIS — S92325A Nondisplaced fracture of second metatarsal bone, left foot, initial encounter for closed fracture: Secondary | ICD-10-CM | POA: Diagnosis not present

## 2024-02-06 ENCOUNTER — Encounter: Admitting: Family

## 2024-02-20 ENCOUNTER — Encounter: Payer: Self-pay | Admitting: Family

## 2024-02-20 ENCOUNTER — Ambulatory Visit: Admitting: Family

## 2024-02-20 VITALS — BP 117/76 | HR 90 | Temp 98.9°F | Resp 16 | Ht 64.0 in | Wt 198.0 lb

## 2024-02-20 DIAGNOSIS — E538 Deficiency of other specified B group vitamins: Secondary | ICD-10-CM | POA: Diagnosis not present

## 2024-02-20 DIAGNOSIS — Z0000 Encounter for general adult medical examination without abnormal findings: Secondary | ICD-10-CM | POA: Diagnosis not present

## 2024-02-20 DIAGNOSIS — Z1231 Encounter for screening mammogram for malignant neoplasm of breast: Secondary | ICD-10-CM

## 2024-02-20 DIAGNOSIS — Z1211 Encounter for screening for malignant neoplasm of colon: Secondary | ICD-10-CM

## 2024-02-20 DIAGNOSIS — R7989 Other specified abnormal findings of blood chemistry: Secondary | ICD-10-CM | POA: Diagnosis not present

## 2024-02-20 DIAGNOSIS — E559 Vitamin D deficiency, unspecified: Secondary | ICD-10-CM

## 2024-02-20 DIAGNOSIS — E785 Hyperlipidemia, unspecified: Secondary | ICD-10-CM | POA: Diagnosis not present

## 2024-02-20 DIAGNOSIS — G2581 Restless legs syndrome: Secondary | ICD-10-CM

## 2024-02-20 DIAGNOSIS — R635 Abnormal weight gain: Secondary | ICD-10-CM | POA: Diagnosis not present

## 2024-02-20 DIAGNOSIS — E739 Lactose intolerance, unspecified: Secondary | ICD-10-CM | POA: Insufficient documentation

## 2024-02-20 MED ORDER — VITAMIN D3 25 MCG (1000 UT) PO CAPS: 1000.0000 [IU] | ORAL_CAPSULE | Freq: Every day | ORAL | Status: AC

## 2024-02-20 MED ORDER — ROPINIROLE HCL 0.25 MG PO TABS: 0.2500 mg | ORAL_TABLET | Freq: Every day | ORAL | 1 refills | Status: AC

## 2024-02-20 NOTE — Assessment & Plan Note (Signed)
  Emphasized colorectal cancer screening due to family history. Discussed colonoscopy benefits over Cologuard. Addressed need for Pap smear and mammogram. - Refer to GI for colonoscopy. - Schedule Pap smear- has menses today.   Elevated total cholesterol with excellent HDL. Weight gain likely contributing. Discussed dietary modifications and alcohol impact. - Order cholesterol panel.  Significant weight gain likely contributing to elevated cholesterol. Discussed dietary changes, exercise and alcohol reduction.

## 2024-02-20 NOTE — Progress Notes (Signed)
 Subjective:     Patient ID: Elizabeth Reese, female    DOB: 31-Mar-1976, 48 y.o.   MRN: [id number]  Chief Complaint  Patient presents with   Gynecologic Exam    Gynecologic Exam Associated symptoms include diarrhea (occasional, depends on what she eats). Pertinent negatives include no constipation, dysuria, headaches, hematuria, joint pain or rash.    Discussed the use of AI scribe software for clinical note transcription with the patient, who gave verbal consent to proceed.  History of Present Illness  DILIA ALEMANY is a 48 year old female who presents for an annual physical exam.  She has not had a colonoscopy and is unsure about her family history of colon cancer, though her maternal grandfather may have had it. Her medication for restless leg syndrome, gabapentin and ropinirole , has been effective, but she ran out and a refill request was denied. She previously took prednisone for leg tenderness, which was part of a workup that revealed a severe vitamin D3 deficiency. She was initially prescribed a high dose of vitamin D3 for 12 weeks and now takes 1000 units daily. She also takes B1 and B12 supplements, though she is unsure of the exact dosages.  She has experienced significant weight gain, which she attributes to her medication use, including gabapentin and ropinirole , and a period of prednisone use. Her diet is generally healthy, avoiding fast food and processed foods, but she struggles with regular exercise. She has reduced her alcohol intake from five or six glasses a day to two glasses a day.  Her cholesterol was high three years ago, with a total cholesterol of 244, but her HDL was excellent at 154. She notes a significant weight increase from 145-150 pounds eight years ago to nearly 200 pounds now.  She experiences occasional diarrhea, particularly after consuming dairy, and is aware of her lactose intolerance. She has regular menstrual cycles, though the timing has  shifted slightly. No current cough, cold symptoms, or urinary issues.  Her family history includes a possible case of colon cancer in her maternal grandfather. She lives with her mother and daughter, which can be overwhelming at times, but she does not report significant depression or anxiety.  Immunizations: Declines flu shot, she states she has had the Hep B series previously Diet:  healthy Exercise: needs improvement Colonoscopy: due Pap Smear:   due- on menses Mammogram: due Vision: up to date Dental: up to date      Health Maintenance Due  Topic Date Due   COVID-19 Vaccine (1) Never done   Colonoscopy  Never done   Cervical Cancer Screening (HPV/Pap Cotest)  12/24/2022    Past Medical History:  Diagnosis Date   Alcohol use disorder 10/10/2021   Anxiety    Baker's cyst of knee    Depression    doing good now   Fatty liver    Migraine    Peripheral vascular disease     Past Surgical History:  Procedure Laterality Date   DILATION AND CURETTAGE OF UTERUS     THERAPEUTIC ABORTION      Family History  Problem Relation Age of Onset   Hypertension Mother    Osteoarthritis Mother    Arthritis Maternal Grandmother    Hypertension Maternal Grandmother    Cancer Maternal Grandmother 86       breast   Colon cancer Maternal Grandfather    Hypertension Maternal Aunt    Breast cancer Maternal Aunt    Diabetes Maternal Uncle  Pancreatic cancer Neg Hx    Stomach cancer Neg Hx    Throat cancer Neg Hx    Liver disease Neg Hx     Social History   Socioeconomic History   Marital status: Single    Spouse name: Not on file   Number of children: 1   Years of education: Not on file   Highest education level: GED or equivalent  Occupational History   Not on file  Tobacco Use   Smoking status: Former    Current packs/day: 0.00    Types: Cigarettes    Quit date: 08/25/2019    Years since quitting: 4.4   Smokeless tobacco: Never   Tobacco comments:    quit  beginning of preg  Vaping Use   Vaping status: Never Used  Substance and Sexual Activity   Alcohol use: Yes    Alcohol/week: 14.0 standard drinks of alcohol    Types: 14 Standard drinks or equivalent per week    Comment: drink 2 glasses of wine daily   Drug use: No   Sexual activity: Not Currently    Birth control/protection: None  Other Topics Concern   Not on file  Social History Narrative   Caffeine use:  No   Regular exercise: no   Works as a Engineer, structural.   Single, no kids   Some college   Lots of family nearby.    One daughter 41   Social Drivers of Health   Financial Resource Strain: Low Risk  (02/19/2024)   Overall Financial Resource Strain (CARDIA)    Difficulty of Paying Living Expenses: Not hard at all  Food Insecurity: No Food Insecurity (02/19/2024)   Hunger Vital Sign    Worried About Running Out of Food in the Last Year: Never true    Ran Out of Food in the Last Year: Never true  Transportation Needs: No Transportation Needs (02/19/2024)   PRAPARE - Administrator, Civil Service (Medical): No    Lack of Transportation (Non-Medical): No  Physical Activity: Insufficiently Active (02/19/2024)   Exercise Vital Sign    Days of Exercise per Week: 1 day    Minutes of Exercise per Session: 10 min  Stress: No Stress Concern Present (02/19/2024)   [employer] of Occupational Health - Occupational Stress Questionnaire    Feeling of Stress: Only a little  Social Connections: Moderately Isolated (02/19/2024)   Social Connection and Isolation Panel    Frequency of Communication with Friends and Family: More than three times a week    Frequency of Social Gatherings with Friends and Family: Twice a week    Attends Religious Services: More than 4 times per year    Active Member of [employer] or Organizations: No    Attends Engineer, structural: Not on file    Marital Status: Never married  Intimate Partner Violence: Not At  Risk (07/28/2018)   Humiliation, Afraid, Rape, and Kick questionnaire    Fear of Current or Ex-Partner: No    Emotionally Abused: No    Physically Abused: No    Sexually Abused: No    Outpatient Medications Prior to Visit  Medication Sig Dispense Refill   gabapentin (NEURONTIN) 300 MG capsule Take 300 mg by mouth.     predniSONE (DELTASONE) 10 MG tablet take 1 tab three times a day for 2 days, 1 tab twice a day for 5 days, 1 tab daily till finished     rOPINIRole  (REQUIP ) 0.25 MG tablet  Take 1 tablet (0.25 mg total) by mouth at bedtime. 90 tablet 0   traMADol (ULTRAM) 50 MG tablet Take 50 mg by mouth every 6 (six) hours as needed.     No facility-administered medications prior to visit.    Allergies  Allergen Reactions   Azithromycin  Itching and Rash    Review of Systems  Constitutional:  Negative for weight loss.  HENT:  Negative for congestion.   Eyes:  Negative for blurred vision.  Respiratory:  Negative for cough.   Cardiovascular:  Negative for leg swelling.  Gastrointestinal:  Positive for diarrhea (occasional, depends on what she eats). Negative for constipation.  Genitourinary:  Negative for dysuria and hematuria.  Musculoskeletal:  Negative for joint pain and myalgias.  Skin:  Negative for rash.  Neurological:  Negative for headaches.  Psychiatric/Behavioral:  Negative for depression. The patient is not nervous/anxious.        Objective:    Physical Exam   BP 117/76 (BP Location: Right Arm, Patient Position: Sitting, Cuff Size: Normal)   Pulse 90   Temp 98.9 F (37.2 C) (Oral)   Resp 16   Ht 5' 4 (1.626 m)   Wt 198 lb (89.8 kg)   SpO2 97%   BMI 33.99 kg/m  Wt Readings from Last 3 Encounters:  02/20/24 198 lb (89.8 kg)  03/26/23 196 lb 6.4 oz (89.1 kg)  01/22/23 196 lb 4.8 oz (89 kg)   Physical Exam  Constitutional: She is oriented to person, place, and time. She appears well-developed and well-nourished. No distress.  HENT:  Head: Normocephalic  and atraumatic.  Right Ear: Tympanic membrane and ear canal normal.  Left Ear: Tympanic membrane and ear canal normal.  Mouth/Throat: Oropharynx is clear and moist.  Eyes: Pupils are equal, round, and reactive to light. No scleral icterus.  Neck: Normal range of motion. No thyromegaly present.  Cardiovascular: Normal rate and regular rhythm.   No murmur heard. Pulmonary/Chest: Effort normal and breath sounds normal. No respiratory distress. He has no wheezes. She has no rales. She exhibits no tenderness.  Abdominal: Soft. Bowel sounds are normal. She exhibits no distension and no mass. There is no tenderness. There is no rebound and no guarding.  Musculoskeletal: She exhibits no edema.  Lymphadenopathy:    She has no cervical adenopathy.  Neurological: She is alert and oriented to person, place, and time. She has normal patellar reflexes. She exhibits normal muscle tone. Coordination normal.  Skin: Skin is warm and dry.  Psychiatric: She has a normal mood and affect. Her behavior is normal. Judgment and thought content normal.             Assessment & Plan:       Assessment & Plan:   Problem List Items Addressed This Visit       Unprioritized   Vitamin D deficiency   Relevant Medications   Cholecalciferol (VITAMIN D3) 25 MCG (1000 UT) CAPS   Other Relevant Orders   Vitamin D (25 hydroxy)   Restless leg   Relevant Medications   rOPINIRole  (REQUIP ) 0.25 MG tablet   Preventative health care - Primary    Emphasized colorectal cancer screening due to family history. Discussed colonoscopy benefits over Cologuard. Addressed need for Pap smear and mammogram. - Refer to GI for colonoscopy. - Schedule Pap smear- has menses today.   Elevated total cholesterol with excellent HDL. Weight gain likely contributing. Discussed dietary modifications and alcohol impact. - Order cholesterol panel.  Significant weight gain likely contributing  to elevated cholesterol. Discussed dietary  changes, exercise and alcohol reduction.      Lactose intolerance   Recommended lactaid prn prior to dairy products.      Hyperlipidemia   Relevant Orders   Lipid panel   Abnormal LFTs   Reports she has cut down to 2 glasses of wine a day from 5+.  Encouraged her to cut down to 1 or less a day. Update lft's.      Relevant Orders   Comp Met (CMET)   Other Visit Diagnoses       Screening for colon cancer       Relevant Orders   Ambulatory referral to Gastroenterology     Breast cancer screening by mammogram       Relevant Orders   MM 3D DIAGNOSTIC MAMMOGRAM BILATERAL BREAST     B12 deficiency       Relevant Orders   B12     Weight gain       Relevant Orders   TSH       I have discontinued Selia A. Vandyken's predniSONE and traMADol. I am also having her start on Vitamin D3. Additionally, I am having her maintain her rOPINIRole  and gabapentin.  Meds ordered this encounter  Medications   rOPINIRole  (REQUIP ) 0.25 MG tablet    Sig: Take 1 tablet (0.25 mg total) by mouth at bedtime.    Dispense:  90 tablet    Refill:  1   Cholecalciferol (VITAMIN D3) 25 MCG (1000 UT) CAPS    Sig: Take 1 capsule (1,000 Units total) by mouth daily.    Supervising Provider:   DOMENICA BLACKBIRD A [phone]

## 2024-02-20 NOTE — Assessment & Plan Note (Signed)
 Reports she has cut down to 2 glasses of wine a day from 5+.  Encouraged her to cut down to 1 or less a day. Update lft's.

## 2024-02-20 NOTE — Patient Instructions (Signed)
 VISIT SUMMARY:  Today, you had your annual physical exam. We discussed several health concerns, including your need for colorectal cancer screening, cholesterol management, weight gain, alcohol use, restless legs syndrome, vitamin deficiencies, and lactose intolerance.  YOUR PLAN:  COLORECTAL CANCER SCREENING: Due to a possible family history of colon cancer, it is important to get screened. -You will be referred to a gastroenterologist for a colonoscopy. -Schedule a Pap smear.  HYPERCHOLESTEROLEMIA: Your total cholesterol was high three years ago, but your HDL was excellent. Your recent weight gain may be contributing to this. -We will order a cholesterol panel to check your current levels.  OVERWEIGHT: You have experienced significant weight gain, which may be affecting your cholesterol levels. -We discussed making dietary changes and reducing alcohol intake to help manage your weight.  ALCOHOL USE: You have reduced your alcohol intake but it is still above the recommended limits. -Try to reduce your alcohol intake to one glass per day.  RESTLESS LEGS SYNDROME: Your medication for restless legs syndrome has been effective, but you ran out and need a refill. -We will address the refill request for your medication.  VITAMIN D DEFICIENCY: You had a severe vitamin D deficiency that has been treated. You are currently on a maintenance dose. -We will order a vitamin D level to check your current status.  VITAMIN B12 DEFICIENCY: You have been taking vitamin B12 supplements, but your levels have not been checked in a year. -We will order a vitamin B12 level to check your current status.  LACTOSE INTOLERANCE: You experience occasional diarrhea after consuming dairy, which is consistent with lactose intolerance. -Consider taking lactase enzyme supplements before consuming dairy products.

## 2024-02-20 NOTE — Assessment & Plan Note (Signed)
 Recommended lactaid prn prior to dairy products.

## 2024-02-21 LAB — COMPREHENSIVE METABOLIC PANEL WITH GFR
AG Ratio: 1.7 (calc) (ref 1.0–2.5)
ALT: 38 U/L — ABNORMAL HIGH (ref 6–29)
AST: 80 U/L — ABNORMAL HIGH (ref 10–35)
Albumin: 4.7 g/dL (ref 3.6–5.1)
Alkaline phosphatase (APISO): 73 U/L (ref 31–125)
BUN: 8 mg/dL (ref 7–25)
CO2: 26 mmol/L (ref 20–32)
Calcium: 9.3 mg/dL (ref 8.6–10.2)
Chloride: 100 mmol/L (ref 98–110)
Creat: 0.61 mg/dL (ref 0.50–0.99)
Globulin: 2.7 g/dL (ref 1.9–3.7)
Glucose, Bld: 87 mg/dL (ref 65–99)
Potassium: 3.9 mmol/L (ref 3.5–5.3)
Sodium: 136 mmol/L (ref 135–146)
Total Bilirubin: 1.5 mg/dL — ABNORMAL HIGH (ref 0.2–1.2)
Total Protein: 7.4 g/dL (ref 6.1–8.1)
eGFR: 111 mL/min/1.73m2 (ref >=60)

## 2024-02-21 LAB — VITAMIN B12: Vitamin B-12: 656 pg/mL (ref 200–1100)

## 2024-02-21 LAB — LIPID PANEL
Cholesterol: 247 mg/dL — ABNORMAL HIGH (ref <200)
HDL: 168 mg/dL (ref >=50)
LDL Cholesterol (Calc): 66 mg/dL
Non-HDL Cholesterol (Calc): 79 mg/dL (ref <130)
Total CHOL/HDL Ratio: 1.5 (calc) (ref <5.0)
Triglycerides: 53 mg/dL (ref <150)

## 2024-02-21 LAB — TSH: TSH: 3.47 m[IU]/L

## 2024-02-21 LAB — VITAMIN D 25 HYDROXY (VIT D DEFICIENCY, FRACTURES): Vit D, 25-Hydroxy: 33 ng/mL (ref 30–100)

## 2024-02-22 ENCOUNTER — Ambulatory Visit: Payer: Self-pay | Admitting: Family

## 2024-02-22 ENCOUNTER — Encounter: Payer: Self-pay | Admitting: Family

## 2024-02-23 MED ORDER — VITAMIN B-12 1000 MCG PO TABS: 1000.0000 ug | ORAL_TABLET | Freq: Every day | ORAL | Status: AC

## 2024-02-23 MED ORDER — THIAMINE HCL 100 MG PO TABS: 100.0000 mg | ORAL_TABLET | Freq: Every day | ORAL | Status: AC

## 2024-03-02 ENCOUNTER — Inpatient Hospital Stay (HOSPITAL_BASED_OUTPATIENT_CLINIC_OR_DEPARTMENT_OTHER): Admission: RE | Admit: 2024-03-02 | Source: Ambulatory Visit

## 2024-03-04 ENCOUNTER — Ambulatory Visit (HOSPITAL_BASED_OUTPATIENT_CLINIC_OR_DEPARTMENT_OTHER)

## 2024-03-09 ENCOUNTER — Ambulatory Visit (HOSPITAL_BASED_OUTPATIENT_CLINIC_OR_DEPARTMENT_OTHER): Admission: RE | Admit: 2024-03-09 | Discharge: 2024-03-09 | Disposition: A | Source: Ambulatory Visit

## 2024-03-09 ENCOUNTER — Ambulatory Visit (HOSPITAL_BASED_OUTPATIENT_CLINIC_OR_DEPARTMENT_OTHER)
Admission: RE | Admit: 2024-03-09 | Discharge: 2024-03-09 | Disposition: A | Discharge status: Home / Self Care | Source: Ambulatory Visit | Attending: Family | Admitting: Family

## 2024-03-09 DIAGNOSIS — Z1231 Encounter for screening mammogram for malignant neoplasm of breast: Secondary | ICD-10-CM | POA: Insufficient documentation

## 2024-03-12 ENCOUNTER — Other Ambulatory Visit: Payer: Self-pay | Admitting: Nurse Practitioner

## 2024-03-12 DIAGNOSIS — R928 Other abnormal and inconclusive findings on diagnostic imaging of breast: Secondary | ICD-10-CM

## 2024-03-17 ENCOUNTER — Ambulatory Visit: Admitting: Family

## 2024-03-22 ENCOUNTER — Ambulatory Visit (HOSPITAL_BASED_OUTPATIENT_CLINIC_OR_DEPARTMENT_OTHER)

## 2024-03-24 ENCOUNTER — Other Ambulatory Visit

## 2024-03-24 ENCOUNTER — Encounter

## 2024-03-30 ENCOUNTER — Ambulatory Visit: Admission: RE | Admit: 2024-03-30 | Source: Ambulatory Visit

## 2024-03-30 ENCOUNTER — Ambulatory Visit
Admission: RE | Admit: 2024-03-30 | Discharge: 2024-03-30 | Disposition: A | Source: Ambulatory Visit | Attending: Nurse Practitioner | Admitting: Nurse Practitioner

## 2024-03-30 DIAGNOSIS — R928 Other abnormal and inconclusive findings on diagnostic imaging of breast: Secondary | ICD-10-CM

## 2024-05-14 ENCOUNTER — Encounter: Payer: Self-pay | Admitting: Gastroenterology

## 2024-06-04 ENCOUNTER — Other Ambulatory Visit: Payer: Self-pay

## 2024-06-04 ENCOUNTER — Ambulatory Visit

## 2024-06-04 VITALS — Ht 65.0 in | Wt 192.0 lb

## 2024-06-04 DIAGNOSIS — Z1211 Encounter for screening for malignant neoplasm of colon: Secondary | ICD-10-CM

## 2024-06-04 MED ORDER — NA SULFATE-K SULFATE-MG SULF 17.5-3.13-1.6 GM/177ML PO SOLN: 1.0000 | Freq: Once | ORAL | 0 refills | Status: AC

## 2024-06-04 NOTE — Progress Notes (Signed)
 Denies allergies to eggs or soy products. Denies complication of anesthesia or sedation. Denies use of weight loss medication. Denies use of O2.   Emmi instructions given for colonoscopy.

## 2024-06-16 ENCOUNTER — Encounter: Admitting: Gastroenterology
